# Patient Record
Sex: Female | Born: 1975
Health system: Southern US, Community
[De-identification: ages and names within clinical notes are randomized; demographics above are authoritative.]

## PROBLEM LIST (undated history)

## (undated) DIAGNOSIS — N920 Excessive and frequent menstruation with regular cycle: Secondary | ICD-10-CM

## (undated) DIAGNOSIS — E559 Vitamin D deficiency, unspecified: Secondary | ICD-10-CM

## (undated) DIAGNOSIS — E538 Deficiency of other specified B group vitamins: Secondary | ICD-10-CM

## (undated) DIAGNOSIS — M5430 Sciatica, unspecified side: Secondary | ICD-10-CM

## (undated) DIAGNOSIS — D259 Leiomyoma of uterus, unspecified: Secondary | ICD-10-CM

## (undated) HISTORY — DX: Excessive and frequent menstruation with regular cycle: N92.0

## (undated) HISTORY — DX: Vitamin D deficiency, unspecified: E55.9

## (undated) HISTORY — PX: NO PAST SURGERIES: SHX2092

## (undated) HISTORY — DX: Deficiency of other specified B group vitamins: E53.8

---

## 2007-04-16 ENCOUNTER — Ambulatory Visit: Payer: Self-pay | Admitting: Family Medicine

## 2013-01-09 ENCOUNTER — Emergency Department: Payer: Self-pay | Admitting: Emergency Medicine

## 2013-01-09 LAB — BASIC METABOLIC PANEL
Anion Gap: 10 (ref 7–16)
Chloride: 107 mmol/L (ref 98–107)
Creatinine: 0.98 mg/dL (ref 0.60–1.30)
EGFR (African American): >60
Glucose: 156 mg/dL — ABNORMAL HIGH (ref 65–99)
Osmolality: 284 (ref 275–301)
Potassium: 3.4 mmol/L — ABNORMAL LOW (ref 3.5–5.1)

## 2013-01-09 LAB — CBC
HCT: 40.4 % (ref 35.0–47.0)
MCH: 28.8 pg (ref 26.0–34.0)
MCHC: 34.4 g/dL (ref 32.0–36.0)
Platelet: 287 10*3/uL (ref 150–440)
RBC: 4.81 10*6/uL (ref 3.80–5.20)
WBC: 9.1 10*3/uL (ref 3.6–11.0)

## 2013-01-10 LAB — TSH: Thyroid Stimulating Horm: 0.903 u[IU]/mL

## 2013-01-27 ENCOUNTER — Other Ambulatory Visit: Payer: Self-pay | Admitting: Neurology

## 2013-01-27 DIAGNOSIS — R200 Anesthesia of skin: Secondary | ICD-10-CM

## 2013-01-31 ENCOUNTER — Other Ambulatory Visit: Payer: Self-pay

## 2013-02-09 ENCOUNTER — Ambulatory Visit
Admission: RE | Admit: 2013-02-09 | Discharge: 2013-02-09 | Disposition: A | Discharge status: Home / Self Care | Payer: BC Managed Care – PPO | Source: Ambulatory Visit | Attending: Neurology | Admitting: Neurology

## 2013-02-09 DIAGNOSIS — R200 Anesthesia of skin: Secondary | ICD-10-CM

## 2013-02-09 MED ORDER — GADOBENATE DIMEGLUMINE 529 MG/ML IV SOLN
13.0000 mL | Freq: Once | INTRAVENOUS | Status: AC | PRN
  Administered 2013-02-09: 13 mL via INTRAVENOUS

## 2016-12-25 ENCOUNTER — Encounter: Payer: Self-pay | Admitting: Obstetrics and Gynecology

## 2016-12-25 ENCOUNTER — Ambulatory Visit (INDEPENDENT_AMBULATORY_CARE_PROVIDER_SITE_OTHER): Payer: BC Managed Care – PPO | Admitting: Obstetrics and Gynecology

## 2016-12-25 VITALS — BP 100/70 | HR 82 | Ht 62.4 in | Wt 149.0 lb

## 2016-12-25 DIAGNOSIS — E559 Vitamin D deficiency, unspecified: Secondary | ICD-10-CM | POA: Diagnosis not present

## 2016-12-25 DIAGNOSIS — Z1231 Encounter for screening mammogram for malignant neoplasm of breast: Secondary | ICD-10-CM

## 2016-12-25 DIAGNOSIS — Z1239 Encounter for other screening for malignant neoplasm of breast: Secondary | ICD-10-CM

## 2016-12-25 DIAGNOSIS — Z01419 Encounter for gynecological examination (general) (routine) without abnormal findings: Secondary | ICD-10-CM

## 2016-12-25 DIAGNOSIS — Z13220 Encounter for screening for lipoid disorders: Secondary | ICD-10-CM

## 2016-12-25 NOTE — Progress Notes (Signed)
PCP:  Patient, No Pcp Per   Chief Complaint  Patient presents with  . Gynecologic Exam     HPI:      [name] is a 41 y.o. No obstetric history on file. who LMP was Patient's last menstrual period was 12/11/2016., presents today for her annual examination.  Her menses are regular every 28-30 days, lasting 5 days, med flow.  Dysmenorrhea mild, occurring first 1-2 days of flow. She does not have intermenstrual bleeding. She had issues with menorrhagia last yr but menses are better this yr (shorter and a little lighter). Sx are tolerable for pt.  Sex activity: single partner, contraception - condoms. Declines other BC.  Last Pap: December 21, 2015  Results were: no abnormalities /neg HPV DNA  Hx of STDs: none  Last mammogram: didn't do it last yr. Will do it this yr. There is no FH of breast cancer. There is no FH of ovarian cancer. The patient does do self-breast exams.  Tobacco use: The patient denies current or previous tobacco use. Alcohol use: none No drug use.  Exercise: moderately active  She does get adequate calcium but not Vitamin D in her diet.  She had Vitamin D deficiency last yr on labs as well as borderline lipids. She is not taking Vit D supp. She is due for lipid rechk.     Past Medical History:  Diagnosis Date  . B12 deficiency   . Menorrhagia   . Vitamin D deficiency     History reviewed. No pertinent surgical history.  Family History  Problem Relation Age of Onset  . Diabetes Mother   . Diabetes Father     Social History   Social History  . Marital status: Married    Spouse name: N/A  . Number of children: N/A  . Years of education: N/A   Occupational History  . Not on file.   Social History Main Topics  . Smoking status: Never Smoker  . Smokeless tobacco: Never Used  . Alcohol use Yes  . Drug use: No  . Sexual activity: Yes    Birth control/ protection: None   Other Topics Concern  . Not on file   Social History  Narrative  . No narrative on file    No outpatient prescriptions have been marked as taking for the 12/25/16 encounter (Office Visit) with Jannelly Bergren, Deirdre Evener, PA-C.     ROS:  Review of Systems  Constitutional: Negative for fatigue, fever and unexpected weight change.  Respiratory: Negative for cough, shortness of breath and wheezing.   Cardiovascular: Negative for chest pain, palpitations and leg swelling.  Gastrointestinal: Negative for blood in stool, constipation, diarrhea, nausea and vomiting.  Endocrine: Negative for cold intolerance, heat intolerance and polyuria.  Genitourinary: Negative for dyspareunia, dysuria, flank pain, frequency, genital sores, hematuria, menstrual problem, pelvic pain, urgency, vaginal bleeding, vaginal discharge and vaginal pain.  Musculoskeletal: Negative for back pain, joint swelling and myalgias.  Skin: Negative for rash.  Neurological: Negative for dizziness, syncope, light-headedness, numbness and headaches.  Hematological: Negative for adenopathy.  Psychiatric/Behavioral: Negative for agitation, confusion, sleep disturbance and suicidal ideas. The patient is not nervous/anxious.      Objective: BP 100/70   Pulse 82   Ht 5' 2.4" (1.585 m)   Wt 149 lb (67.6 kg)   LMP 12/11/2016   BMI 26.90 kg/m    Physical Exam  Constitutional: She is oriented to person, place, and time. She appears well-developed and well-nourished.  Genitourinary:  Vagina normal and uterus normal. There is no rash or tenderness on the right labia. There is no rash or tenderness on the left labia. No erythema or tenderness in the vagina. No vaginal discharge found. Right adnexum does not display mass and does not display tenderness. Left adnexum does not display mass and does not display tenderness. Cervix does not exhibit motion tenderness or polyp. Uterus is not enlarged or tender.  Neck: Normal range of motion. No thyromegaly present.  Cardiovascular: Normal rate, regular  rhythm and normal heart sounds.   No murmur heard. Pulmonary/Chest: Effort normal and breath sounds normal. Right breast exhibits no mass, no nipple discharge, no skin change and no tenderness. Left breast exhibits no mass, no nipple discharge, no skin change and no tenderness.  Abdominal: Soft. There is no tenderness. There is no guarding.  Musculoskeletal: Normal range of motion.  Neurological: She is alert and oriented to person, place, and time. No cranial nerve deficit.  Psychiatric: She has a normal mood and affect. Her behavior is normal.  Vitals reviewed.   Assessment/Plan: Encounter for annual routine gynecological examination  Screening for breast cancer - Pt to sched mammo. - Plan: MM DIGITAL SCREENING BILATERAL  Screening cholesterol level - Borderline last yr. Rechk labs.  - Plan: Lipid panel  Vitamin D deficiency - Add Vit D3 5000 IU daily.           GYN counsel mammography screening, adequate intake of calcium and vitamin D, diet and exercise     F/U  Return in about 1 year (around 12/25/2017).  Nickson Middlesworth B. Simcha Speir, PA-C 12/25/2016 8:37 AM

## 2016-12-26 LAB — LIPID PANEL
CHOLESTEROL TOTAL: 192 mg/dL (ref 100–199)
Chol/HDL Ratio: 3.7 ratio (ref 0.0–4.4)
HDL: 52 mg/dL (ref >39)
LDL Calculated: 112 mg/dL — ABNORMAL HIGH (ref 0–99)
TRIGLYCERIDES: 141 mg/dL (ref 0–149)
VLDL Cholesterol Cal: 28 mg/dL (ref 5–40)

## 2018-05-01 ENCOUNTER — Ambulatory Visit: Payer: BC Managed Care – PPO | Admitting: Family Medicine

## 2018-05-01 ENCOUNTER — Encounter: Payer: Self-pay | Admitting: Family Medicine

## 2018-05-01 VITALS — BP 110/70 | HR 86 | Temp 98.5°F | Resp 14 | Ht 62.0 in | Wt 160.0 lb

## 2018-05-01 DIAGNOSIS — E663 Overweight: Secondary | ICD-10-CM

## 2018-05-01 DIAGNOSIS — E559 Vitamin D deficiency, unspecified: Secondary | ICD-10-CM

## 2018-05-01 DIAGNOSIS — M25551 Pain in right hip: Secondary | ICD-10-CM | POA: Insufficient documentation

## 2018-05-01 DIAGNOSIS — S46819A Strain of other muscles, fascia and tendons at shoulder and upper arm level, unspecified arm, initial encounter: Secondary | ICD-10-CM

## 2018-05-01 DIAGNOSIS — Z1389 Encounter for screening for other disorder: Secondary | ICD-10-CM

## 2018-05-01 DIAGNOSIS — M5442 Lumbago with sciatica, left side: Secondary | ICD-10-CM

## 2018-05-01 DIAGNOSIS — Z23 Encounter for immunization: Secondary | ICD-10-CM | POA: Diagnosis not present

## 2018-05-01 DIAGNOSIS — Z1231 Encounter for screening mammogram for malignant neoplasm of breast: Secondary | ICD-10-CM

## 2018-05-01 DIAGNOSIS — E538 Deficiency of other specified B group vitamins: Secondary | ICD-10-CM

## 2018-05-01 DIAGNOSIS — M5441 Lumbago with sciatica, right side: Secondary | ICD-10-CM

## 2018-05-01 DIAGNOSIS — Z13220 Encounter for screening for lipoid disorders: Secondary | ICD-10-CM | POA: Diagnosis not present

## 2018-05-01 DIAGNOSIS — Z114 Encounter for screening for human immunodeficiency virus [HIV]: Secondary | ICD-10-CM

## 2018-05-01 DIAGNOSIS — M542 Cervicalgia: Secondary | ICD-10-CM

## 2018-05-01 DIAGNOSIS — L301 Dyshidrosis [pompholyx]: Secondary | ICD-10-CM

## 2018-05-01 DIAGNOSIS — Z1159 Encounter for screening for other viral diseases: Secondary | ICD-10-CM

## 2018-05-01 DIAGNOSIS — G8929 Other chronic pain: Secondary | ICD-10-CM

## 2018-05-01 DIAGNOSIS — Z8261 Family history of arthritis: Secondary | ICD-10-CM

## 2018-05-01 NOTE — Progress Notes (Signed)
Name: Sarah Carrillo   MRN: [id number]    DOB: June 05, 1975   Date:05/01/2018       Progress Note  Subjective  Chief Complaint  Chief Complaint  Patient presents with  . Establish Care  . Hip Pain    righ hip    HPI  PT presents to establish care. She has not been seen in several years.   MSK Pain: Shoulder, neck, bilateral lower back pain with BLE radiation, LEFT hip pain.  LEFT hip pain is the worst area - has been flaring up over the last month or so.  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  She has been taking yoga classes and this seems to help her pain. Endorses morning joint stiffness; no swelling, redness of joints, no small joint involvement. Takes 432m Ibuprofen PRN and this takes care of the pain.  She notes pain is well controlled today.  We will check inflammatory markers today, and will also have her return if she has a pain flare to recheck labs during a flare.  Mom has history of RA.  Overweight: Started doing yoga recently.  She has a dietician at her job which she can utilize for free.  Enjoys cooking and eating fresh vegetables. Works late at night and has a long commute - eats fast food sometimes.  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    Dry Skin/Abnormal Nevus: She would like to see a dermatologist.  Has history of dyshydrotic eczema on bilateral feet - has some dry scaling skin on feet still.  Also has nevus on the right posterior shoulder that has been present for several years, but has been growing in size lately. No bleeding or drainage from the nevus.   Health Maintenance: See orders.  Patient Active Problem List   Diagnosis Date Noted  . B12 deficiency   . Vitamin D deficiency 12/25/2016    History reviewed. No pertinent surgical history.  Family History  Problem Relation Age of Onset  . Diabetes Mother   . Rheum arthritis Mother   . Hypertension Mother   .  Hyperlipidemia Mother   . Diabetes Father   . Hyperlipidemia Sister   . Hyperlipidemia Sister     Social History   Socioeconomic History  . Marital status: Married    Spouse name: SMarzetta Board . Number of children: 2  . Years of education: Not on file  . Highest education level: Not on file  Occupational History  . Not on file  Social Needs  . Financial resource strain: Not hard at all  . Food insecurity:    Worry: Never true    Inability: Never true  . Transportation needs:    Medical: No    Non-medical: No  Tobacco Use  . Smoking status: Never Smoker  . Smokeless tobacco: Never Used  Substance and Sexual Activity  . Alcohol use: Yes    Comment: occasional  . Drug use: No  . Sexual activity: Yes    Partners: Male    Birth control/protection: None  Lifestyle  . Physical activity:    Days per week: 1 day    Minutes per session: 30 min  . Stress: Not at all  Relationships  . Social connections:    Talks on phone: More than three times a week    Gets together: More than three times a  week    Attends religious service: 1 to 4 times per year    Active member of club or organization: No    Attends meetings of clubs or organizations: Never    Relationship status: Married  . Intimate partner violence:    Fear of current or ex partner: No    Emotionally abused: No    Physically abused: No    Forced sexual activity: No  Other Topics Concern  . Not on file  Social History Narrative   Married - Husband's name is Erline Levine; Son is Chase (1yo), Daughter Luvenia Starch (26) in college.    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    No current outpatient medications on file.  Allergies  Allergen Reactions  . Penicillin G Other (See Comments)  . Sulfa Antibiotics Rash    I personally reviewed active problem list, medication list, allergies, family history, social history, health maintenance, lab results with the patient/caregiver today.   ROS  Constitutional: Negative for  fever or weight change.  Respiratory: Negative for cough and shortness of breath.   Cardiovascular: Negative for chest pain or palpitations.  Gastrointestinal: Negative for abdominal pain, no bowel changes.  Musculoskeletal: Negative for gait problem or joint swelling.  Skin: Negative for rash.  Neurological: Negative for dizziness or headache.  No other specific complaints in a complete review of systems (except as listed in HPI above).   Objective  Vitals:   05/01/18 0848  BP: 110/70  Pulse: 86  Resp: 14  Temp: 98.5 F (36.9 C)  SpO2: 94%  Weight: 160 lb (72.6 kg)  Height: '5\' 2"'  (1.575 m)   Body mass index is 29.26 kg/m.  Physical Exam Constitutional: Patient appears well-developed and well-nourished. No distress.  HENT: Head: Normocephalic and atraumatic.  Eyes: Conjunctivae and EOM are normal. No scleral icterus. Neck: Normal range of motion. Neck supple. No JVD present. No thyromegaly present.  Cardiovascular: Normal rate, regular rhythm and normal heart sounds.  No murmur heard. No BLE edema. Pulmonary/Chest: Effort normal and breath sounds normal. No respiratory distress. Musculoskeletal: Normal range of motion, no joint effusions. No gross deformities Neurological: Pt is alert and oriented to person, place, and time. No cranial nerve deficit. Coordination, balance, strength, speech and gait are normal.  Skin: Skin is warm and dry. No rash noted. No erythema. Single raised, round, firm, hyperpigmented lesion that is non-tender with no underlying erythema to the right posterior shoulder. Approx 1cm diameter. Psychiatric: Patient has a normal mood and affect. behavior is normal. Judgment and thought content normal.  No results found for this or any previous visit (from the past 72 hour(s)).  PHQ2/9: Depression screen [facility] 2/9 05/01/2018  Decreased Interest 0  Down, Depressed, Hopeless 0  PHQ - 2 Score 0  Altered sleeping 0  Tired, decreased energy 0  Change in  appetite 0  Feeling bad or failure about yourself  0  Trouble concentrating 0  Moving slowly or fidgety/restless 0  Suicidal thoughts 0  PHQ-9 Score 0  Difficult doing work/chores Not difficult at all   Fall Risk: Fall Risk  05/01/2018  Falls in the past year? 0  Number falls in past yr: 0  Injury with Fall? 0  Follow up Falls evaluation completed   Assessment & Plan  1. Right hip pain - C-reactive protein - Sed Rate (ESR)  2. Chronic bilateral low back pain with bilateral sciatica - C-reactive protein - Sed Rate (ESR)  3. Overweight (BMI 25.0-29.9) - COMPLETE  METABOLIC PANEL WITH GFR - Lipid panel - TSH - Discussed importance of 150 minutes of physical activity weekly, eat two servings of fish weekly, eat one serving of tree nuts ( cashews, pistachios, pecans, almonds.Marland Kitchen) every other day, eat 6 servings of fruit/vegetables daily and drink plenty of water and avoid sweet beverages.  4. Lipid screening - Lipid panel  5. Family history of rheumatoid arthritis - C-reactive protein - Sed Rate (ESR) - CBC w/Diff/Platelet  6. Encounter for surveillance of abnormal nevi - Ambulatory referral to Dermatology  7. Neck pain - C-reactive protein - Sed Rate (ESR)  8. Strain of trapezius muscle, unspecified laterality, initial encounter - C-reactive protein - Sed Rate (ESR)  9. Dyshidrotic eczema - Ambulatory referral to Dermatology  10. B12 deficiency - B12 and Folate Panel  11. Vitamin D deficiency - VITAMIN D 25 Hydroxy (Vit-D Deficiency, Fractures)  12. Encounter for screening for HIV - HIV Antibody (routine testing w rflx)  13. Need for hepatitis C screening test - Hepatitis C antibody  14. Breast cancer screening by mammogram - MM 3D SCREEN BREAST BILATERAL; Future  15. Need for Tdap vaccination - Tdap vaccine greater than or equal to 7yo IM  16. Needs flu shot - Flu Vaccine QUAD 6+ mos PF IM (Fluarix Quad PF)

## 2018-05-02 LAB — CBC WITH DIFFERENTIAL/PLATELET
ABSOLUTE MONOCYTES: 312 {cells}/uL (ref 200–950)
Basophils Absolute: 42 cells/uL (ref 0–200)
Basophils Relative: 0.7 %
Eosinophils Absolute: 108 cells/uL (ref 15–500)
Eosinophils Relative: 1.8 %
HCT: 41.8 % (ref 35.0–45.0)
Hemoglobin: 13.9 g/dL (ref 11.7–15.5)
Lymphs Abs: 2052 cells/uL (ref 850–3900)
MCH: 27.4 pg (ref 27.0–33.0)
MCHC: 33.3 g/dL (ref 32.0–36.0)
MCV: 82.4 fL (ref 80.0–100.0)
MPV: 10.8 fL (ref 7.5–12.5)
Monocytes Relative: 5.2 %
Neutro Abs: 3486 cells/uL (ref 1500–7800)
Neutrophils Relative %: 58.1 %
Platelets: 306 10*3/uL (ref 140–400)
RBC: 5.07 10*6/uL (ref 3.80–5.10)
RDW: 12.5 % (ref 11.0–15.0)
Total Lymphocyte: 34.2 %
WBC: 6 10*3/uL (ref 3.8–10.8)

## 2018-05-02 LAB — COMPLETE METABOLIC PANEL WITH GFR
AG Ratio: 1.6 (calc) (ref 1.0–2.5)
ALKALINE PHOSPHATASE (APISO): 55 U/L (ref 33–115)
ALT: 9 U/L (ref 6–29)
AST: 13 U/L (ref 10–30)
Albumin: 4.6 g/dL (ref 3.6–5.1)
BUN: 15 mg/dL (ref 7–25)
CO2: 30 mmol/L (ref 20–32)
Calcium: 9.9 mg/dL (ref 8.6–10.2)
Chloride: 104 mmol/L (ref 98–110)
Creat: 0.73 mg/dL (ref 0.50–1.10)
GFR, Est African American: 117 mL/min/{1.73_m2} (ref >=60)
GFR, Est Non African American: 101 mL/min/{1.73_m2} (ref >=60)
Globulin: 2.9 g/dL (calc) (ref 1.9–3.7)
Glucose, Bld: 85 mg/dL (ref 65–99)
Potassium: 4.8 mmol/L (ref 3.5–5.3)
Sodium: 140 mmol/L (ref 135–146)
Total Bilirubin: 0.6 mg/dL (ref 0.2–1.2)
Total Protein: 7.5 g/dL (ref 6.1–8.1)

## 2018-05-02 LAB — TSH: TSH: 0.4 mIU/L

## 2018-05-02 LAB — SEDIMENTATION RATE: Sed Rate: 17 mm/h (ref 0–20)

## 2018-05-02 LAB — HEPATITIS C ANTIBODY
Hepatitis C Ab: NONREACTIVE
SIGNAL TO CUT-OFF: 0.14 (ref <1.00)

## 2018-05-02 LAB — LIPID PANEL
Cholesterol: 191 mg/dL (ref <200)
HDL: 46 mg/dL — ABNORMAL LOW (ref >50)
LDL Cholesterol (Calc): 123 mg/dL (calc) — ABNORMAL HIGH
Non-HDL Cholesterol (Calc): 145 mg/dL (calc) — ABNORMAL HIGH (ref <130)
Total CHOL/HDL Ratio: 4.2 (calc) (ref <5.0)
Triglycerides: 108 mg/dL (ref <150)

## 2018-05-02 LAB — C-REACTIVE PROTEIN: CRP: 6.3 mg/L (ref <8.0)

## 2018-05-02 LAB — B12 AND FOLATE PANEL
Folate: 15.7 ng/mL
Vitamin B-12: 339 pg/mL (ref 200–1100)

## 2018-05-02 LAB — HIV ANTIBODY (ROUTINE TESTING W REFLEX): HIV 1&2 Ab, 4th Generation: NONREACTIVE

## 2018-05-02 LAB — VITAMIN D 25 HYDROXY (VIT D DEFICIENCY, FRACTURES): Vit D, 25-Hydroxy: 10 ng/mL — ABNORMAL LOW (ref 30–100)

## 2018-05-04 ENCOUNTER — Other Ambulatory Visit: Payer: Self-pay | Admitting: Family Medicine

## 2018-05-04 DIAGNOSIS — E559 Vitamin D deficiency, unspecified: Secondary | ICD-10-CM

## 2018-05-04 MED ORDER — VITAMIN D (ERGOCALCIFEROL) 1.25 MG (50000 UNIT) PO CAPS: 50000.0000 [IU] | ORAL_CAPSULE | ORAL | 0 refills | Status: DC

## 2018-06-30 ENCOUNTER — Encounter: Payer: BC Managed Care – PPO | Admitting: Family Medicine

## 2018-11-26 ENCOUNTER — Ambulatory Visit (INDEPENDENT_AMBULATORY_CARE_PROVIDER_SITE_OTHER): Payer: BC Managed Care – PPO | Admitting: Nurse Practitioner

## 2018-11-26 ENCOUNTER — Other Ambulatory Visit: Payer: Self-pay

## 2018-11-26 ENCOUNTER — Encounter: Payer: Self-pay | Admitting: Nurse Practitioner

## 2018-11-26 VITALS — Resp 14

## 2018-11-26 DIAGNOSIS — K625 Hemorrhage of anus and rectum: Secondary | ICD-10-CM | POA: Diagnosis not present

## 2018-11-26 DIAGNOSIS — K644 Residual hemorrhoidal skin tags: Secondary | ICD-10-CM | POA: Diagnosis not present

## 2018-11-26 DIAGNOSIS — R143 Flatulence: Secondary | ICD-10-CM

## 2018-11-26 NOTE — Patient Instructions (Addendum)
- Use rectal hydrocortisone cream 1-2.5% twice daily for one week; can additionally add stool softner like docusate sodium if having any straining with bowel movements. Use donut pillow, avoid sitting for long periods- or break up sitting periods with standing.  Hemorrhoids Hemorrhoids are swollen veins in and around the rectum or anus. There are two types of hemorrhoids:  Internal hemorrhoids. These occur in the veins that are just inside the rectum. They may poke through to the outside and become irritated and painful.  External hemorrhoids. These occur in the veins that are outside the anus and can be felt as a painful swelling or hard lump near the anus. Most hemorrhoids do not cause serious problems, and they can be managed with home treatments such as diet and lifestyle changes. If home treatments do not help the symptoms, procedures can be done to shrink or remove the hemorrhoids. What are the causes? This condition is caused by increased pressure in the anal area. This pressure may result from various things, including:  Constipation.  Straining to have a bowel movement.  Diarrhea.  Pregnancy.  Obesity.  Sitting for long periods of time.  Heavy lifting or other activity that causes you to strain.  Anal sex.  Riding a bike for a long period of time. What are the signs or symptoms? Symptoms of this condition include:  Pain.  Anal itching or irritation.  Rectal bleeding.  Leakage of stool (feces).  Anal swelling.  One or more lumps around the anus. How is this diagnosed? This condition can often be diagnosed through a visual exam. Other exams or tests may also be done, such as:  An exam that involves feeling the rectal area with a gloved hand (digital rectal exam).  An exam of the anal canal that is done using a small tube (anoscope).  A blood test, if you have lost a significant amount of blood.  A test to look inside the colon using a flexible tube with a  camera on the end (sigmoidoscopy or colonoscopy). How is this treated? This condition can usually be treated at home. However, various procedures may be done if dietary changes, lifestyle changes, and other home treatments do not help your symptoms. These procedures can help make the hemorrhoids smaller or remove them completely. Some of these procedures involve surgery, and others do not. Common procedures include:  Rubber band ligation. Rubber bands are placed at the base of the hemorrhoids to cut off their blood supply.  Sclerotherapy. Medicine is injected into the hemorrhoids to shrink them.  Infrared coagulation. A type of light energy is used to get rid of the hemorrhoids.  Hemorrhoidectomy surgery. The hemorrhoids are surgically removed, and the veins that supply them are tied off.  Stapled hemorrhoidopexy surgery. The surgeon staples the base of the hemorrhoid to the rectal wall. Follow these instructions at home: Eating and drinking   Eat foods that have a lot of fiber in them, such as whole grains, beans, nuts, fruits, and vegetables.  Ask your health care provider about taking products that have added fiber (fiber supplements).  Reduce the amount of fat in your diet. You can do this by eating low-fat dairy products, eating less red meat, and avoiding processed foods.  Drink enough fluid to keep your urine pale yellow. Managing pain and swelling  Take warm sitz baths for 20 minutes, 3-4 times a day to ease pain and discomfort. You may do this in a bathtub or using a portable sitz bath that  fits over the toilet.  If directed, apply ice to the affected area. Using ice packs between sitz baths may be helpful. ? Put ice in a plastic bag. ? Place a towel between your skin and the bag. ? Leave the ice on for 20 minutes, 2-3 times a day. General instructions  Take over-the-counter and prescription medicines only as told by your health care provider.  Use medicated creams or  suppositories as told.  Get regular exercise. Ask your health care provider how much and what kind of exercise is best for you. In general, you should do moderate exercise for at least 30 minutes on most days of the week (150 minutes each week). This can include activities such as walking, biking, or yoga.  Go to the bathroom when you have the urge to have a bowel movement. Do not wait.  Avoid straining to have bowel movements.  Keep the anal area dry and clean. Use wet toilet paper or moist towelettes after a bowel movement.  Do not sit on the toilet for long periods of time. This increases blood pooling and pain.  Keep all follow-up visits as told by your health care provider. This is important. Contact a health care provider if you have:  Increasing pain and swelling that are not controlled by treatment or medicine.  Difficulty having a bowel movement, or you are unable to have a bowel movement.  Pain or inflammation outside the area of the hemorrhoids. Get help right away if you have:  Uncontrolled bleeding from your rectum. Summary  Hemorrhoids are swollen veins in and around the rectum or anus.  Most hemorrhoids can be managed with home treatments such as diet and lifestyle changes.  Taking warm sitz baths can help ease pain and discomfort.  In severe cases, procedures or surgery can be done to shrink or remove the hemorrhoids. This information is not intended to replace advice given to you by your health care provider. Make sure you discuss any questions you have with your health care provider. Document Released: 03/22/2000 Document Revised: 04/02/2018 Document Reviewed: 08/14/2017 Elsevier Patient Education  [place]  FODMAPs (fermentable oligosaccharides, disaccharides, monosaccharides, and polyols) are sugars that are hard for some people to digest. A low-FODMAP eating plan may help some people who have bowel (intestinal) diseases  to manage their symptoms. This meal plan can be complicated to follow. Work with a diet and nutrition specialist (dietitian) to make a low-FODMAP eating plan that is right for you. A dietitian can make sure that you get enough nutrition from this diet. What are tips for following this plan? Reading food labels  Check labels for hidden FODMAPs such as: ? High-fructose syrup. ? Honey. ? Agave. ? Natural fruit flavors. ? Onion or garlic powder.  Choose low-FODMAP foods that contain 3-4 grams of fiber per serving.  Check food labels for serving sizes. Eat only one serving at a time to make sure FODMAP levels stay low. Meal planning  Follow a low-FODMAP eating plan for up to 6 weeks, or as told by your health care provider or dietitian.  To follow the eating plan: 1. Eliminate high-FODMAP foods from your diet completely. 2. Gradually reintroduce high-FODMAP foods into your diet one at a time. Most people should wait a few days after introducing one high-FODMAP food before they introduce the next high-FODMAP food. Your dietitian can recommend how quickly you may reintroduce foods. 3. Keep a daily record of what you eat and  drink, and make note of any symptoms that you have after eating. 4. Review your daily record with a dietitian regularly. Your dietitian can help you identify which foods you can eat and which foods you should avoid. General tips  Drink enough fluid each day to keep your urine pale yellow.  Avoid processed foods. These often have added sugar and may be high in FODMAPs.  Avoid most dairy products, whole grains, and sweeteners.  Work with a dietitian to make sure you get enough fiber in your diet. Recommended foods Grains  Gluten-free grains, such as rice, oats, buckwheat, quinoa, corn, polenta, and millet. Gluten-free pasta, bread, or cereal. Rice noodles. Corn tortillas. Vegetables  Eggplant, zucchini, cucumber, peppers, green beans, Brussels sprouts, bean sprouts,  lettuce, arugula, kale, Swiss chard, spinach, collard greens, bok choy, summer squash, potato, and tomato. Limited amounts of corn, carrot, and sweet potato. Green parts of scallions. Fruits  Bananas, oranges, lemons, limes, blueberries, raspberries, strawberries, grapes, cantaloupe, honeydew melon, kiwi, papaya, passion fruit, and pineapple. Limited amounts of dried cranberries, banana chips, and shredded coconut. Dairy  Lactose-free milk, yogurt, and kefir. Lactose-free cottage cheese and ice cream. Non-dairy milks, such as almond, coconut, hemp, and rice milk. Yogurts made of non-dairy milks. Limited amounts of goat cheese, brie, mozzarella, parmesan, swiss, and other hard cheeses. Meats and other protein foods  Unseasoned beef, pork, poultry, or fish. Eggs. Berniece Salines. Tofu (firm) and tempeh. Limited amounts of nuts and seeds, such as almonds, walnuts, [place] nuts, pecans, peanuts, pumpkin seeds, chia seeds, and sunflower seeds. Fats and oils  Butter-free spreads. Vegetable oils, such as olive, canola, and sunflower oil. Seasoning and other foods  Artificial sweeteners with names that do not end in "ol" such as aspartame, saccharine, and stevia. Maple syrup, white table sugar, raw sugar, brown sugar, and molasses. Fresh basil, coriander, parsley, rosemary, and thyme. Beverages  Water and mineral water. Sugar-sweetened soft drinks. Small amounts of orange juice or cranberry juice. Black and green tea. Most dry wines. Coffee. This may not be a complete list of low-FODMAP foods. Talk with your dietitian for more information. Foods to avoid Grains  Wheat, including kamut, durum, and semolina. Barley and bulgur. Couscous. Wheat-based cereals. Wheat noodles, bread, crackers, and pastries. Vegetables  Chicory root, artichoke, asparagus, cabbage, snow peas, sugar snap peas, mushrooms, and cauliflower. Onions, garlic, leeks, and the white part of scallions. Fruits  Fresh, dried, and juiced forms  of apple, pear, watermelon, peach, plum, cherries, apricots, blackberries, boysenberries, figs, nectarines, and mango. Avocado. Dairy  Milk, yogurt, ice cream, and soft cheese. Cream and sour cream. Milk-based sauces. Custard. Meats and other protein foods  Fried or fatty meat. Sausage. Cashews and pistachios. Soybeans, baked beans, black beans, chickpeas, kidney beans, fava beans, navy beans, lentils, and split peas. Seasoning and other foods  Any sugar-free gum or candy. Foods that contain artificial sweeteners such as sorbitol, mannitol, isomalt, or xylitol. Foods that contain honey, high-fructose corn syrup, or agave. Bouillon, vegetable stock, beef stock, and chicken stock. Garlic and onion powder. Condiments made with onion, such as hummus, chutney, pickles, relish, salad dressing, and salsa. Tomato paste. Beverages  Chicory-based drinks. Coffee substitutes. Chamomile tea. Fennel tea. Sweet or fortified wines such as port or sherry. Diet soft drinks made with isomalt, mannitol, maltitol, sorbitol, or xylitol. Apple, pear, and mango juice. Juices with high-fructose corn syrup. This may not be a complete list of high-FODMAP foods. Talk with your dietitian to discuss what dietary choices are best for you.  Summary  A low-FODMAP eating plan is a short-term diet that eliminates FODMAPs from your diet to help ease symptoms of certain bowel diseases.  The eating plan usually lasts up to 6 weeks. After that, high-FODMAP foods are restarted gradually, one at a time, so you can find out which may be causing symptoms.  A low-FODMAP eating plan can be complicated. It is best to work with a dietitian who has experience with this type of plan. This information is not intended to replace advice given to you by your health care provider. Make sure you discuss any questions you have with your health care provider. Document Released: 11/19/2016 Document Revised: 03/07/2017 Document Reviewed: 11/19/2016  Elsevier Patient Education  2020 [employer].

## 2018-11-26 NOTE — Progress Notes (Signed)
Virtual Visit via Video Note  I connected with Sarah Carrillo on 11/26/18 at 11:40 AM EDT by a video enabled telemedicine application and verified that I am speaking with the correct person using two identifiers.   Staff discussed the limitations of evaluation and management by telemedicine and the availability of in person appointments. The patient expressed understanding and agreed to proceed.  Patient location: home  My location: home office Other people present:  none HPI  States has noticed bright red blood in stools for the past 2 days. Had a similar episode last month that self-resolved. Denies severe constipation- but when these episodes happened did have to strain with bowel movement. Had a history of hemorrhoid after having children's.  States she does feel like she has a lot of abdominal bloating gas for the last couple of years. Patient tries to avoid fast food but did have some last night, eats a lot of vegetables and salads, eats more poorly when she is busy. Endorses flatulence after meals with improvement of symptoms.  Denies abdominal pain, nausea, vomiting, diarrhea.  No family history of colorectal cancers.  More sitting with work from home sitting for up to 14 hours. Denies anal trauma PHQ2/9: Depression screen [facility] 2/9 11/26/2018 05/01/2018  Decreased Interest 0 0  Down, Depressed, Hopeless 0 0  PHQ - 2 Score 0 0  Altered sleeping 0 0  Tired, decreased energy 0 0  Change in appetite 0 0  Feeling bad or failure about yourself  0 0  Trouble concentrating 0 0  Moving slowly or fidgety/restless 0 0  Suicidal thoughts 0 0  PHQ-9 Score 0 0  Difficult doing work/chores Not difficult at all Not difficult at all     PHQ reviewed. Negative  Patient Active Problem List   Diagnosis Date Noted  . Right hip pain 05/01/2018  . Chronic bilateral low back pain with bilateral sciatica 05/01/2018  . Overweight (BMI 25.0-29.9) 05/01/2018  . Family history of rheumatoid arthritis  05/01/2018  . Neck pain 05/01/2018  . Trapezius strain 05/01/2018  . Dyshidrotic eczema 05/01/2018  . B12 deficiency   . Vitamin D deficiency 12/25/2016    Past Medical History:  Diagnosis Date  . B12 deficiency   . Menorrhagia   . Vitamin D deficiency     History reviewed. No pertinent surgical history.  Social History   Tobacco Use  . Smoking status: Never Smoker  . Smokeless tobacco: Never Used  Substance Use Topics  . Alcohol use: Yes    Comment: occasional     Current Outpatient Medications:  .  cholecalciferol (VITAMIN D3) 25 MCG (1000 UT) tablet, Take 1,000 Units by mouth daily., Disp: , Rfl:   Allergies  Allergen Reactions  . Penicillin G Other (See Comments)  . Sulfa Antibiotics Rash    ROS   No other specific complaints in a complete review of systems (except as listed in HPI above).  Objective  Vitals:   11/26/18 1208  Resp: 14     There is no height or weight on file to calculate BMI.  Nursing Note and Vital Signs reviewed.  Physical Exam  Constitutional: Patient appears well-developed and well-nourished. No distress.  HENT: Head: Normocephalic and atraumatic. Pulmonary/Chest: Effort normal  Musculoskeletal: Normal range of motion,  Neurological: alert and oriented, speech normal.  Psychiatric: Patient has a normal mood and affect. behavior is normal. Judgment and thought content normal.    Assessment & Plan  1. BRBPR (bright red blood per rectum) Discussed  likely from hemorrhoid, if persistent with treatment let us know   2. External hemorrhoid, bleeding OTC relief discussed, increase fiber, donut pillow  Referral to GI if not improved in 4-6 weeks or if worsening at anytime.   3. Flatulence Low fodmap diet discussed.     Follow Up Instructions:    I discussed the assessment and treatment plan with the patient. The patient was provided an opportunity to ask questions and all were answered. The patient agreed with the plan  and demonstrated an understanding of the instructions.   The patient was advised to call back or seek an in-person evaluation if the symptoms worsen or if the condition fails to improve as anticipated.  I provided 15 minutes of non-face-to-face time during this encounter.   Fredderick Severance, NP

## 2018-11-30 ENCOUNTER — Ambulatory Visit: Payer: BC Managed Care – PPO | Admitting: Obstetrics and Gynecology

## 2019-01-07 ENCOUNTER — Ambulatory Visit
Admission: RE | Admit: 2019-01-07 | Discharge: 2019-01-07 | Disposition: A | Discharge status: Home / Self Care | Payer: BC Managed Care – PPO | Source: Ambulatory Visit | Attending: Family Medicine | Admitting: Family Medicine

## 2019-01-07 DIAGNOSIS — Z1231 Encounter for screening mammogram for malignant neoplasm of breast: Secondary | ICD-10-CM

## 2019-01-20 NOTE — Progress Notes (Signed)
PCP:  Hubbard Hartshorn, FNP   Chief Complaint  Patient presents with  . Gynecologic Exam     HPI:      [name] is a 43 y.o. No obstetric history on file. who LMP was Patient's last menstrual period was 12/28/2018., presents today for her annual examination.  Her menses are regular every 28-30 days, lasting 5 days, med flow.  Dysmenorrhea mild, occurring first 1-2 days of flow. She does not have intermenstrual bleeding. She had issues with menorrhagia 2 yrs ago but menses are better now. Sarah Carrillo has a heavier period but sx are tolerable for pt.  Sex activity: single partner, contraception - condoms. Declines other BC.  Last Pap: December 21, 2015  Results were: no abnormalities /neg HPV DNA  Hx of STDs: none  Last mammogram: 01/07/19 Results: no abnormalities; repeat in 12 months.  There is no FH of breast cancer. There is no FH of ovarian cancer. The patient does do self-breast exams.  Tobacco use: The patient denies current or previous tobacco use. Alcohol use: none No drug use.  Exercise: moderately active  She does get adequate calcium and Vitamin D in her diet. Hx of Vit D deficiency, given Rx Vit D earlier this yr, now doing OTC supp. Would like recheck. Feels better and back pain resolved with Vit D use.  Has had hacking cough at night only recently. No other covid sx. No sx during the day. Thinks it's allergies and taking Claritin. Did try dayquil with sx relief.   Labs with PCP.  Past Medical History:  Diagnosis Date  . B12 deficiency   . Menorrhagia   . Vitamin D deficiency     History reviewed. No pertinent surgical history.  Family History  Problem Relation Age of Onset  . Diabetes Mother   . Rheum arthritis Mother   . Hypertension Mother   . Hyperlipidemia Mother   . Diabetes Father   . Hyperlipidemia Sister   . Hyperlipidemia Sister   . Breast cancer Neg Hx     Social History   Socioeconomic History  . Marital status: Married    Spouse  name: Marzetta Board  . Number of children: 2  . Years of education: Not on file  . Highest education level: Not on file  Occupational History  . Not on file  Social Needs  . Financial resource strain: Not hard at all  . Food insecurity    Worry: Never true    Inability: Never true  . Transportation needs    Medical: No    Non-medical: No  Tobacco Use  . Smoking status: Never Smoker  . Smokeless tobacco: Never Used  Substance and Sexual Activity  . Alcohol use: Yes    Comment: occasional  . Drug use: No  . Sexual activity: Yes    Partners: Male    Birth control/protection: None  Lifestyle  . Physical activity    Days per week: 1 day    Minutes per session: 30 min  . Stress: Not at all  Relationships  . Social connections    Talks on phone: More than three times a week    Gets together: More than three times a week    Attends religious service: 1 to 4 times per year    Active member of club or organization: No    Attends meetings of clubs or organizations: Never    Relationship status: Married  . Intimate partner violence    Fear of  current or ex partner: No    Emotionally abused: No    Physically abused: No    Forced sexual activity: No  Other Topics Concern  . Not on file  Social History Narrative   Married - Husband's name is Erline Levine; Son is Chase (16yo), Daughter Luvenia Starch (68) in college.    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    No outpatient medications have been marked as taking for the 01/21/19 encounter (Office Visit) with ,  B, PA-C.     ROS:  Review of Systems  Constitutional: Negative for fatigue, fever and unexpected weight change.  Respiratory: Positive for cough. Negative for shortness of breath and wheezing.   Cardiovascular: Negative for chest pain, palpitations and leg swelling.  Gastrointestinal: Negative for blood in stool, constipation, diarrhea, nausea and vomiting.  Endocrine: Negative for cold intolerance, heat intolerance  and polyuria.  Genitourinary: Negative for dyspareunia, dysuria, flank pain, frequency, genital sores, hematuria, menstrual problem, pelvic pain, urgency, vaginal bleeding, vaginal discharge and vaginal pain.  Musculoskeletal: Negative for back pain, joint swelling and myalgias.  Skin: Negative for rash.  Neurological: Negative for dizziness, syncope, light-headedness, numbness and headaches.  Hematological: Negative for adenopathy.  Psychiatric/Behavioral: Negative for agitation, confusion, sleep disturbance and suicidal ideas. The patient is not nervous/anxious.      Objective: BP 100/60   Ht 5' 2.5" (1.588 m)   Wt 164 lb (74.4 kg)   LMP 12/28/2018   BMI 29.52 kg/m    Physical Exam Constitutional:      General: She is not in acute distress.    Appearance: She is well-developed.  Genitourinary:     Vulva, vagina, uterus, right adnexa and left adnexa normal.     No vulval lesion, tenderness or ulcerations noted.     No vaginal discharge, erythema, tenderness or bleeding.     No cervical motion tenderness or polyp.     Uterus is not enlarged or tender.     No right or left adnexal mass present.     Right adnexa not tender.     Left adnexa not tender.  Neck:     Musculoskeletal: Normal range of motion.     Thyroid: No thyromegaly.  Cardiovascular:     Rate and Rhythm: Normal rate and regular rhythm.     Heart sounds: Normal heart sounds. No murmur.  Pulmonary:     Effort: Pulmonary effort is normal.     Breath sounds: Normal breath sounds.  Chest:     Breasts:        Right: No mass, nipple discharge, skin change or tenderness.        Left: No mass, nipple discharge, skin change or tenderness.  Abdominal:     Palpations: Abdomen is soft.     Tenderness: There is no abdominal tenderness. There is no guarding.  Musculoskeletal: Normal range of motion.  Neurological:     General: No focal deficit present.     Mental Status: She is alert and oriented to person, place, and  time.     Cranial Nerves: No cranial nerve deficit.  Skin:    General: Skin is warm and dry.  Psychiatric:        Mood and Affect: Mood normal.        Behavior: Behavior normal.        Thought Content: Thought content normal.        Judgment: Judgment normal.  Vitals signs and nursing note reviewed.  Assessment/Plan: Encounter for annual routine gynecological examination  Encounter for screening mammogram for malignant neoplasm of breast; pt current on mammo  Vitamin D deficiency - Plan: VITAMIN D 25 Hydroxy (Vit-D Deficiency, Fractures); Lab recheck today, will call with results.   Need for immunization against influenza - Plan: Flu Vaccine QUAD 36+ mos IM           GYN counsel mammography screening, adequate intake of calcium and vitamin D, diet and exercise     F/U  Return in about 1 year (around 01/21/2020).   B. , PA-C 01/21/2019 11:37 AM

## 2019-01-20 NOTE — Patient Instructions (Signed)
I value your feedback and entrusting us with your care. If you get a [facility] patient survey, I would appreciate you taking the time to let us know about your experience today. Thank you! 

## 2019-01-21 ENCOUNTER — Ambulatory Visit (INDEPENDENT_AMBULATORY_CARE_PROVIDER_SITE_OTHER): Payer: BC Managed Care – PPO | Admitting: Obstetrics and Gynecology

## 2019-01-21 ENCOUNTER — Encounter: Payer: Self-pay | Admitting: Obstetrics and Gynecology

## 2019-01-21 ENCOUNTER — Other Ambulatory Visit: Payer: Self-pay

## 2019-01-21 VITALS — BP 100/60 | Ht 62.5 in | Wt 164.0 lb

## 2019-01-21 DIAGNOSIS — Z1231 Encounter for screening mammogram for malignant neoplasm of breast: Secondary | ICD-10-CM

## 2019-01-21 DIAGNOSIS — Z23 Encounter for immunization: Secondary | ICD-10-CM

## 2019-01-21 DIAGNOSIS — Z01419 Encounter for gynecological examination (general) (routine) without abnormal findings: Secondary | ICD-10-CM

## 2019-01-21 DIAGNOSIS — E559 Vitamin D deficiency, unspecified: Secondary | ICD-10-CM

## 2019-01-22 ENCOUNTER — Ambulatory Visit: Payer: BC Managed Care – PPO | Admitting: Family Medicine

## 2019-01-22 LAB — VITAMIN D 25 HYDROXY (VIT D DEFICIENCY, FRACTURES): Vit D, 25-Hydroxy: 26.2 ng/mL — ABNORMAL LOW (ref 30.0–100.0)

## 2019-01-22 NOTE — Progress Notes (Signed)
Pls let pt know Vit D still low but improved. Cont Vit D3 2000 IU daily forever. Thx

## 2019-01-22 NOTE — Progress Notes (Signed)
Called pt, no answer, LVMTRC. 

## 2019-01-25 NOTE — Progress Notes (Signed)
Pt aware, mentioned she had seen the results through mychart.

## 2019-01-28 ENCOUNTER — Other Ambulatory Visit: Payer: Self-pay

## 2019-01-28 ENCOUNTER — Encounter: Payer: Self-pay | Admitting: Family Medicine

## 2019-01-28 ENCOUNTER — Ambulatory Visit: Payer: BC Managed Care – PPO | Admitting: Family Medicine

## 2019-01-28 VITALS — BP 110/70 | HR 81 | Temp 97.5°F | Resp 16 | Ht 63.0 in | Wt 164.8 lb

## 2019-01-28 DIAGNOSIS — E782 Mixed hyperlipidemia: Secondary | ICD-10-CM

## 2019-01-28 DIAGNOSIS — M25559 Pain in unspecified hip: Secondary | ICD-10-CM

## 2019-01-28 DIAGNOSIS — E559 Vitamin D deficiency, unspecified: Secondary | ICD-10-CM

## 2019-01-28 DIAGNOSIS — K644 Residual hemorrhoidal skin tags: Secondary | ICD-10-CM

## 2019-01-28 DIAGNOSIS — E663 Overweight: Secondary | ICD-10-CM

## 2019-01-28 DIAGNOSIS — L301 Dyshidrosis [pompholyx]: Secondary | ICD-10-CM

## 2019-01-28 NOTE — Progress Notes (Signed)
Name: Sarah Carrillo   MRN: [id number]    DOB: [birth year]   Date:01/28/2019       Progress Note  Subjective  Chief Complaint  Chief Complaint  Patient presents with  . Follow-up    HPI Pt presents today for her 3 month follow-up   Overweight: Has not been doing yoga recently due to Covid. Enjoys cooking and eating fresh vegetables but is trying to cut back on fast foods. Works late at night at home and is attempting to cut back.  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  She is trying to exercise by doing yoga 1-2 days a week. 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    Hemmorhoids: recently with bright red blood in stools last month at this point is is resolved at this point with no new episodes. She has used preperation H. Discussed referral to GI, she declines for now, will wait to see if she develops symptoms again. No dark and tarry stools, abdominal pain, or family history of colorectal cancer.   Vitamin D deficiency: taking her vitamin D 3-4 times weekly but has still yielded positive results and has noticed her energy level has greatly improved. She attempts to walk weekly and sits close to a window for some sunlight. Vit D, 25-hydroxy status was 26.2  Dry Skin/Abnormal Nevus: She's has seen a  dermatologist since last visit for nevus on the right posterior shoulder that has been present for several years. She was informed that no intervention was necessary at this time. Previous history of dyshydrotic eczema on bilateral feet but no complications without dry scaling skin on feet.    Arthalgias: better with vitamin D improvement.  She states she spoke with her mom and there is no family history of RA.  LDL and HDL abnormal: Due for Lipid recheck.  No chest pain, shortness of breath, myalgias, family history of MI younger than 44yo.  Flu shot  is up to-date.  PAP smear is up to date  Mammogram up to date  Patient Active Problem List   Diagnosis Date Noted  . Right hip pain 05/01/2018  . Chronic bilateral low back pain with bilateral sciatica 05/01/2018  . Overweight (BMI 25.0-29.9) 05/01/2018  . Family history of rheumatoid arthritis 05/01/2018  . Neck pain 05/01/2018  . Trapezius strain 05/01/2018  . Dyshidrotic eczema 05/01/2018  . B12 deficiency   . Vitamin D deficiency 12/25/2016    No past surgical history on file.  Family History  Problem Relation Age of Onset  . Diabetes Mother   . Rheum arthritis Mother   . Hypertension Mother   . Hyperlipidemia Mother   . Diabetes Father   . Hyperlipidemia Sister   . Hyperlipidemia Sister   . Breast cancer Neg Hx     Social History   Socioeconomic History  . Marital status: Married    Spouse name: Marzetta Board  . Number of children: 2  . Years of education: Not on file  . Highest education level: Not on file  Occupational History  . Not on file  Social Needs  . Financial resource strain: Not hard at all  . Food insecurity    Worry: Never true    Inability: Never true  . Transportation needs    Medical: No    Non-medical: No  Tobacco Use  .  Smoking status: Never Smoker  . Smokeless tobacco: Never Used  Substance and Sexual Activity  . Alcohol use: Yes    Comment: occasional  . Drug use: No  . Sexual activity: Yes    Partners: Male    Birth control/protection: None  Lifestyle  . Physical activity    Days per week: 1 day    Minutes per session: 30 min  . Stress: Not at all  Relationships  . Social connections    Talks on phone: More than three times a week    Gets together: More than three times a week    Attends religious service: 1 to 4 times per year    Active member of club or organization: No    Attends meetings of clubs or organizations: Never    Relationship status: Married  . Intimate partner violence    Fear of current or ex partner: No     Emotionally abused: No    Physically abused: No    Forced sexual activity: No  Other Topics Concern  . Not on file  Social History Narrative   Married - Husband's name is Erline Levine; Son is Chase (44yo), Daughter Luvenia Starch (44) in college.    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     Current Outpatient Medications:  .  cholecalciferol (VITAMIN D3) 25 MCG (1000 UT) tablet, Take 1,000 Units by mouth daily., Disp: , Rfl:   Allergies  Allergen Reactions  . Penicillin G Other (See Comments)  . Sulfa Antibiotics Rash    I personally reviewed active problem list, medication list, allergies, health maintenance, notes from last encounter, lab results with the patient/caregiver today.   Review of Systems  Constitutional: Negative.   HENT: Negative.   Eyes: Negative.   Respiratory: Negative.   Cardiovascular: Negative.  Negative for chest pain.  Gastrointestinal: Negative.   Genitourinary: Negative.   Musculoskeletal: Negative.   Skin: Negative.   Neurological: Negative.   Psychiatric/Behavioral: Negative.   All other systems reviewed and are negative.   Objective  Vitals:   01/28/19 1121  BP: 110/70  Pulse: 81  Resp: 16  Temp: (!) 97.5 F (36.4 C)  TempSrc: Oral  SpO2: 95%  Weight: 74.8 kg  Height: 5\' 3"  (1.6 m)    Body mass index is 29.19 kg/m.  Physical Exam Constitutional:      General: She is not in acute distress.    Appearance: Normal appearance. She is well-developed.  HENT:     Head: Normocephalic and atraumatic.  Neck:     Musculoskeletal: Normal range of motion and neck supple.  Cardiovascular:     Rate and Rhythm: Normal rate and regular rhythm.     Pulses: Normal pulses.     Heart sounds: Normal heart sounds. No murmur. No friction rub. No gallop.   Pulmonary:     Effort: Pulmonary effort is normal. No respiratory distress.     Breath sounds: Normal breath sounds. No stridor. No wheezing or rhonchi.  Musculoskeletal: Normal range of motion.         General: No swelling or tenderness.  Skin:    General: Skin is warm and dry.     Coloration: Skin is not pale.     Findings: No erythema or rash.  Neurological:     General: No focal deficit present.     Mental Status: She is alert and oriented to person, place, and time.  Psychiatric:        Mood and  Affect: Mood normal.        Behavior: Behavior normal.        Thought Content: Thought content normal.        Judgment: Judgment normal.        PHQ2/9: Depression screen [facility] 2/9 01/28/2019 11/26/2018 05/01/2018  Decreased Interest 0 0 0  Down, Depressed, Hopeless 0 0 0  PHQ - 2 Score 0 0 0  Altered sleeping 0 0 0  Tired, decreased energy 0 0 0  Change in appetite 0 0 0  Feeling bad or failure about yourself  0 0 0  Trouble concentrating 0 0 0  Moving slowly or fidgety/restless 0 0 0  Suicidal thoughts 0 0 0  PHQ-9 Score 0 0 0  Difficult doing work/chores Not difficult at all Not difficult at all Not difficult at all   PHQ-2/9 Result is negative.    Fall Risk: Fall Risk  01/28/2019 11/26/2018 05/01/2018  Falls in the past year? 0 0 0  Number falls in past yr: 0 0 0  Injury with Fall? 0 0 0  Follow up Falls evaluation completed - Falls evaluation completed    Assessment & Plan  1. Overweight (BMI 25.0-29.9) - Discussed importance of 150 minutes of physical activity weekly, eat two servings of fish weekly, eat one serving of tree nuts ( cashews, pistachios, pecans, almonds.Marland Kitchen) every other day, eat 6 servings of fruit/vegetables daily and drink plenty of water and avoid sweet beverages.   2. External hemorrhoid, bleeding -If reoccurrence should happen, utilize hydrocortisone suppositories. If further complications or hemorrhoids become more frequent, please follow up for consult with gastroenterology.   3. Vitamin D deficiency -Continue cholecalciferol (VITAMIN D3) 25 MCG (1000 UT) tablet, Take 1,000 Units by mouth daily.  4. Dyshidrotic eczema - Resolved; Monitor skin  for any changes along with use of moisturizers as needed.   5. Arthralgia of hip, unspecified laterality -Continue cholecalciferol (VITAMIN D3) 25 MCG (1000 UT) tablet, Take 1,000 Units by mouth daily. - Improved with supplementation.  6. Mixed hyperlipidemia - Lipid Profile -Diet low in cholesterol   Face-to-face time with patient was more than 25 minutes, >50% time spent counseling and coordination of care

## 2019-01-28 NOTE — Patient Instructions (Signed)
Hydrocortisone Suppositories.

## 2019-01-29 LAB — LIPID PANEL
Cholesterol: 249 mg/dL — ABNORMAL HIGH (ref <200)
HDL: 45 mg/dL — ABNORMAL LOW (ref >=50)
LDL Cholesterol (Calc): 166 mg/dL (calc) — ABNORMAL HIGH
Non-HDL Cholesterol (Calc): 204 mg/dL (calc) — ABNORMAL HIGH (ref <130)
Total CHOL/HDL Ratio: 5.5 (calc) — ABNORMAL HIGH (ref <5.0)
Triglycerides: 227 mg/dL — ABNORMAL HIGH (ref <150)

## 2019-06-10 ENCOUNTER — Ambulatory Visit: Payer: Self-pay | Admitting: *Deleted

## 2019-06-10 NOTE — Telephone Encounter (Signed)
Pt called with complaints of abdominal pain that is constant,and rated 6-7 out of 10; the pain radiates to her back; she states is the last day of her menstrual period, but she normally does not cramps during a period; she says it feels like a heaviness in her stomach; she says her abdomen is tender to touch; her temp is 96.6; recommendations made per nurse triage protocol; she verbalized understanding; the pt sees Raelyn Ensign, [facility] ; will route to office for notification.  Reason for Disposition . Patient sounds very sick or weak to the triager  Answer Assessment - Initial Assessment Questions 1. LOCATION: "Where does it hurt?"      Lower abdomen 2. RADIATION: "Does the pain shoot anywhere else?" (e.g., chest, back)    back 3. ONSET: "When did the pain begin?" (e.g., minutes, hours or days ago)     06/09/19 around 2100; took Midol 4. SUDDEN: "Gradual or sudden onset?"    gradually 5. PATTERN "Does the pain come and go, or is it constant?"    - If constant: "Is it getting better, staying the same, or worsening?"      (Note: Constant means the pain never goes away completely; most serious pain is constant and it progresses)     - If intermittent: "How long does it last?" "Do you have pain now?"     (Note: Intermittent means the pain goes away completely between bouts)     constant 6. SEVERITY: "How bad is the pain?"  (e.g., Scale 1-10; mild, moderate, or severe)   - MILD (1-3): doesn't interfere with normal activities, abdomen soft and not tender to touch    - MODERATE (4-7): interferes with normal activities or awakens from sleep, tender to touch    - SEVERE (8-10): excruciating pain, doubled over, unable to do any normal activities     6-7 out of 10 7. RECURRENT SYMPTOM: "Have you ever had this type of abdominal pain before?" If so, ask: "When was the last time?" and "What happened that time?"     Yes; after having intercourse 8. CAUSE: "What do you think is causing the  abdominal pain?"     Not sure 9. RELIEVING/AGGRAVATING FACTORS: "What makes it better or worse?" (e.g., movement, antacids, bowel movement)   Advil has helped with pain 10. OTHER SYMPTOMS: "Has there been any vomiting, diarrhea, constipation, or urine problems?"      Uncomfortable to walk 11. PREGNANCY: "Is there any chance you are pregnant?" "When was your last menstrual period?"       06/06/19  Protocols used: ABDOMINAL PAIN - [facility]

## 2019-06-10 NOTE — Telephone Encounter (Signed)
Patient notified, she stated she will be checked at [facility]

## 2019-06-15 ENCOUNTER — Other Ambulatory Visit: Payer: Self-pay

## 2019-06-15 ENCOUNTER — Encounter: Payer: Self-pay | Admitting: Obstetrics and Gynecology

## 2019-06-15 ENCOUNTER — Ambulatory Visit (INDEPENDENT_AMBULATORY_CARE_PROVIDER_SITE_OTHER): Payer: 59

## 2019-06-15 ENCOUNTER — Ambulatory Visit (INDEPENDENT_AMBULATORY_CARE_PROVIDER_SITE_OTHER): Payer: 59 | Admitting: Obstetrics and Gynecology

## 2019-06-15 VITALS — BP 110/80 | Ht 63.0 in | Wt 165.0 lb

## 2019-06-15 DIAGNOSIS — D251 Intramural leiomyoma of uterus: Secondary | ICD-10-CM

## 2019-06-15 DIAGNOSIS — R102 Pelvic and perineal pain: Secondary | ICD-10-CM

## 2019-06-15 DIAGNOSIS — D219 Benign neoplasm of connective and other soft tissue, unspecified: Secondary | ICD-10-CM

## 2019-06-15 NOTE — Patient Instructions (Signed)
I value your feedback and entrusting us with your care. If you get a [facility] patient survey, I would appreciate you taking the time to let us know about your experience today. Thank you!  As of March 18, 2019, your lab results will be released to your MyChart immediately, before I even have a chance to see them. Please give me time to review them and contact you if there are any abnormalities. Thank you for your patience.  

## 2019-06-15 NOTE — Progress Notes (Signed)
Sarah Hartshorn, FNP   Chief Complaint  Patient presents with  . Pelvic Pain    denies uti sx, entire pelvic area, had some spotting last sunday    HPI:      Sarah Carrillo is a 44 y.o. [id number] who LMP was Patient's last menstrual period was 06/06/2019 (exact date)., presents today for pelvic pain that started at end of her LMP (about 5 days ago). Pain was very crampy and intense for several hrs, hurt to walk; radiated to low back and down legs. Pt took ibup 400 mg and pain persisted, although slightly improved. Pain eventually resolved the next day and then became heaviness and now just a soreness. LMP was normal, except had 1 extra day spotting on Sat (day after severe pain). No GI, urin, vag sx. No fevers. Pt also had 1 day of severe cramping mid cycle this past month after sex (no pain during sex). No postcoital bleeding. Took 2 ibup with sx relief. Sx very unusual for pt. No new partners, using condoms.  Menses are regular every 28-30 days, lasting 5 days, med flow. Dysmenorrhea mild, occurring first 1-2 days of flow. Last annual 10/20.   Past Medical History:  Diagnosis Date  . B12 deficiency   . Menorrhagia   . Vitamin D deficiency     History reviewed. No pertinent surgical history.  Family History  Problem Relation Age of Onset  . Diabetes Mother   . Rheum arthritis Mother   . Hypertension Mother   . Hyperlipidemia Mother   . Diabetes Father   . Hyperlipidemia Sister   . Hyperlipidemia Sister   . Breast cancer Neg Hx     Social History   Socioeconomic History  . Marital status: Married    Spouse name: Marzetta Board  . Number of children: 2  . Years of education: Not on file  . Highest education level: Not on file  Occupational History  . Not on file  Tobacco Use  . Smoking status: Never Smoker  . Smokeless tobacco: Never Used  Substance and Sexual Activity  . Alcohol use: Yes    Comment: occasional  . Drug use: No  . Sexual activity: Yes    Partners:  Male    Birth control/protection: None  Other Topics Concern  . Not on file  Social History Narrative   Married - Husband's name is Erline Levine; Son is Chase (54yo), Daughter Luvenia Starch (32) in college.    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   Social Determinants of Health   Financial Resource Strain:   . Difficulty of Paying Living Expenses: Not on file  Food Insecurity:   . Worried About Charity fundraiser in the Last Year: Not on file  . Ran Out of Food in the Last Year: Not on file  Transportation Needs:   . Lack of Transportation (Medical): Not on file  . Lack of Transportation (Non-Medical): Not on file  Physical Activity:   . Days of Exercise per Week: Not on file  . Minutes of Exercise per Session: Not on file  Stress:   . Feeling of Stress : Not on file  Social Connections:   . Frequency of Communication with Friends and Family: Not on file  . Frequency of Social Gatherings with Friends and Family: Not on file  . Attends Religious Services: Not on file  . Active Member of Clubs or Organizations: Not on file  . Attends Archivist Meetings:  Not on file  . Marital Status: Not on file  Intimate Partner Violence:   . Fear of Current or Ex-Partner: Not on file  . Emotionally Abused: Not on file  . Physically Abused: Not on file  . Sexually Abused: Not on file    Outpatient Medications Prior to Visit  Medication Sig Dispense Refill  . cholecalciferol (VITAMIN D3) 25 MCG (1000 UT) tablet Take 1,000 Units by mouth daily.     No facility-administered medications prior to visit.      ROS:  Review of Systems  Constitutional: Negative for fatigue, fever and unexpected weight change.  Respiratory: Negative for cough, shortness of breath and wheezing.   Cardiovascular: Negative for chest pain, palpitations and leg swelling.  Gastrointestinal: Negative for blood in stool, constipation, diarrhea, nausea and vomiting.  Endocrine: Negative for cold intolerance,  heat intolerance and polyuria.  Genitourinary: Positive for pelvic pain. Negative for dyspareunia, dysuria, flank pain, frequency, genital sores, hematuria, menstrual problem, urgency, vaginal bleeding, vaginal discharge and vaginal pain.  Musculoskeletal: Negative for back pain, joint swelling and myalgias.  Skin: Negative for rash.  Neurological: Negative for dizziness, syncope, light-headedness, numbness and headaches.  Hematological: Negative for adenopathy.  Psychiatric/Behavioral: Negative for agitation, confusion, sleep disturbance and suicidal ideas. The patient is not nervous/anxious.   BREAST: No symptoms   OBJECTIVE:   Vitals:  BP 110/80   Ht 5\' 3"  (1.6 m)   Wt 165 lb (74.8 kg)   LMP 06/06/2019 (Exact Date)   BMI 29.23 kg/m   Physical Exam Vitals reviewed.  Constitutional:      Appearance: She is well-developed.  Pulmonary:     Effort: Pulmonary effort is normal.  Abdominal:     Palpations: Abdomen is soft.     Tenderness: There is abdominal tenderness in the suprapubic area. There is no guarding or rebound.  Genitourinary:    General: Normal vulva.     Pubic Area: No rash.      Labia:        Right: No rash, tenderness or lesion.        Left: No rash, tenderness or lesion.      Vagina: Normal. No vaginal discharge, erythema or tenderness.     Cervix: Normal.     Uterus: Normal. Enlarged. Not tender.      Adnexa: Right adnexa normal and left adnexa normal.       Right: No mass or tenderness.         Left: No mass or tenderness.    Musculoskeletal:        General: Normal range of motion.     Cervical back: Normal range of motion.  Skin:    General: Skin is warm and dry.  Neurological:     General: No focal deficit present.     Mental Status: She is alert and oriented to person, place, and time.  Psychiatric:        Mood and Affect: Mood normal.        Behavior: Behavior normal.        Thought Content: Thought content normal.        Judgment: Judgment  normal.    Results:  ULTRASOUND REPORT  Location: [facility]  Date of Service: 06/15/2019    Indications:Pelvic Pain Findings:  The uterus is anteverted and measures 15.4 x 6.9 x 6.5 cm. Echo texture is heterogenous with evidence of focal masses. Within the uterus are multiple suspected fibroids measuring: Fibroid 1: 76.8 x 69.4 x 62.2  mm pedunculated vs subserosal anterior/fundal Fibroid 2:45.6 x 28.6 x 38.4 mm pedunculated right Fibroid 3: 27.8 x 20.2 x 25.7 mm intramural posterior Fibroid 4: 25.4 x 16.6 x 22.0 mm intramural right Fibroid 5: 26.7 x 22.4 x 28.8 mm intramural left  The Endometrium measures 4.6 mm. The endometrium was clearly visualized and appears normal.   Right Ovary measures 2.7 x 1.8 x 1.6  cm. It is normal in appearance. Left Ovary measures 3.8 x 1.3 x 1.1  cm. It is normal in appearance. Survey of the adnexa demonstrates no adnexal masses. There is no free fluid in the cul de sac.  Impression: 1. There are at least 5 uterine fibroids.  2. The largest is anterior to the fundus and measures 7.7 cm with blood flow.  3. Norma appearing endometrium. 4. Normal appearing cervix and ovaries.   Recommendations: 1.Clinical correlation with the patient's History and Physical Exam.  Gweneth Dimitri, RT  Assessment/Plan: Pelvic pain - Plan: US PELVIC COMPLETE WITH TRANSVAGINAL, Sx improved. GYN u/s with 5 leio, neg ovaries. Question ruptured ovar cyst a couple wks ago vs leio vs other. Since sx gone now, will follow sx and cycles for now. Menses are normal and tolerable. Pt to f/u prn.   Leiomyoma--will cont to follow sx prn.     Return for 4:30 GYN u/s today--[facility] to call pt.  Tassie Pollett B. Chlora Mcbain, PA-C 06/16/2019 11:35 AM

## 2019-06-16 ENCOUNTER — Other Ambulatory Visit: Payer: 59

## 2019-06-16 DIAGNOSIS — D219 Benign neoplasm of connective and other soft tissue, unspecified: Secondary | ICD-10-CM | POA: Insufficient documentation

## 2019-07-02 ENCOUNTER — Ambulatory Visit: Payer: 59 | Attending: Internal Medicine

## 2019-07-02 DIAGNOSIS — Z23 Encounter for immunization: Secondary | ICD-10-CM

## 2019-07-02 NOTE — Progress Notes (Signed)
   Covid-19 Vaccination Clinic  Name:  Sarah Carrillo    MRN: [id number] DOB: [birth year]  07/02/2019  [name] was observed post Covid-19 immunization for 15 minutes without incident. She was provided with Vaccine Information Sheet and instruction to access the V-Safe system.   [name] was instructed to call 911 with any severe reactions post vaccine: Marland Kitchen Difficulty breathing  . Swelling of face and throat  . A fast heartbeat  . A bad rash all over body  . Dizziness and weakness   Immunizations Administered    Name Date Dose VIS Date Route   Pfizer COVID-19 Vaccine 07/02/2019 12:12 PM 0.3 mL 03/19/2019 Intramuscular   Manufacturer: [place]   Lot: [id number]   [place]: [id number]

## 2019-07-27 ENCOUNTER — Ambulatory Visit: Payer: 59 | Attending: Internal Medicine

## 2019-07-27 DIAGNOSIS — Z23 Encounter for immunization: Secondary | ICD-10-CM

## 2019-07-27 NOTE — Progress Notes (Signed)
   Covid-19 Vaccination Clinic  Name:  Sarah Carrillo    MRN: [id number] DOB: 1975/09/12  07/27/2019  [name] was observed post Covid-19 immunization for 15 minutes without incident. She was provided with Vaccine Information Sheet and instruction to access the V-Safe system.   [name] was instructed to call 911 with any severe reactions post vaccine: Marland Kitchen Difficulty breathing  . Swelling of face and throat  . A fast heartbeat  . A bad rash all over body  . Dizziness and weakness   Immunizations Administered    Name Date Dose VIS Date Route   Pfizer COVID-19 Vaccine 07/27/2019  9:47 AM 0.3 mL 06/02/2018 Intramuscular   Manufacturer: [place]   Lot: [id number]   [place]: [id number]

## 2019-08-21 ENCOUNTER — Ambulatory Visit: Payer: 59

## 2019-11-12 DIAGNOSIS — Z20822 Contact with and (suspected) exposure to covid-19: Secondary | ICD-10-CM | POA: Diagnosis not present

## 2020-01-25 ENCOUNTER — Ambulatory Visit: Payer: Self-pay | Admitting: Internal Medicine

## 2020-01-25 NOTE — Progress Notes (Signed)
Patient ID: Sarah Carrillo, female    DOB: 18-Sep-1975, 44 y.o.   MRN: [id number]  PCP: Hubbard Hartshorn, FNP  Chief Complaint  Patient presents with  . Form Completion    Subjective:   Sarah Carrillo is a 44 y.o. female, presents to clinic with CC of the following:  Chief Complaint  Patient presents with  . Form Completion    HPI:  Patient is a 44 year old female former patient of Raelyn Ensign Last visit to [facility] was in October 2020 Follow-up by Raquel Sarna after labs obtained at that visit included the following:  Your cholesterol is much higher than last time - your LDL is now 166, and your HDL is 45 which is a bit low. Work on diet and exercise like we discussed. I'm including some information below. No need for medication at this point.   Follows up today with a form to be completed. Father owned some land in [place], and has to get form completed. The form basically verifies that she is alive presently. She has no specific medical complaints today, and has a follow-up appointment scheduled in December She noted she is heading to [facility] tomorrow, and grew up there, and we shared some foods enjoyed in [place] that she plans to get when she is there.  Hyperlipidemia  Medication regimen-none  Lab Results  Component Value Date   CHOL 249 (H) 01/28/2019   HDL 45 (L) 01/28/2019   LDLCALC 166 (H) 01/28/2019   TRIG 227 (H) 01/28/2019   CHOLHDL 5.5 (H) 01/28/2019    Overweight:  Weight has remained stable in the recent past   Wt Readings from Last 3 Encounters:  01/26/20 163 lb 1.6 oz (74 kg)  06/15/19 165 lb (74.8 kg)  01/28/19 164 lb 12.8 oz (74.8 kg)          Patient Active Problem List   Diagnosis Date Noted  . Leiomyoma 06/16/2019  . Right hip pain 05/01/2018  . Chronic bilateral low back pain with bilateral sciatica 05/01/2018  . Overweight (BMI 25.0-29.9) 05/01/2018  . Family history of rheumatoid arthritis 05/01/2018  . Neck pain 05/01/2018  .  Trapezius strain 05/01/2018  . Dyshidrotic eczema 05/01/2018  . B12 deficiency   . Vitamin D deficiency 12/25/2016      Current Outpatient Medications:  .  cholecalciferol (VITAMIN D3) 25 MCG (1000 UT) tablet, Take 1,000 Units by mouth daily., Disp: , Rfl:    Allergies  Allergen Reactions  . Penicillin G Other (See Comments)  . Sulfa Antibiotics Rash     History reviewed. No pertinent surgical history.   Family History  Problem Relation Age of Onset  . Diabetes Mother   . Rheum arthritis Mother   . Hypertension Mother   . Hyperlipidemia Mother   . Diabetes Father   . Hyperlipidemia Sister   . Hyperlipidemia Sister   . Breast cancer Neg Hx      Social History   Tobacco Use  . Smoking status: Never Smoker  . Smokeless tobacco: Never Used  Substance Use Topics  . Alcohol use: Yes    Comment: occasional    With staff assistance, above reviewed with the patient today.  ROS: As per HPI, otherwise no specific complaints on a limited and focused system review   No results found for this or any previous visit (from the past 72 hour(s)).   PHQ2/9: Depression screen [facility] 2/9 01/26/2020 01/28/2019 11/26/2018 05/01/2018  Decreased Interest 0 0 0 0  Down,  Depressed, Hopeless 0 0 0 0  PHQ - 2 Score 0 0 0 0  Altered sleeping - 0 0 0  Tired, decreased energy - 0 0 0  Change in appetite - 0 0 0  Feeling bad or failure about yourself  - 0 0 0  Trouble concentrating - 0 0 0  Moving slowly or fidgety/restless - 0 0 0  Suicidal thoughts - 0 0 0  PHQ-9 Score - 0 0 0  Difficult doing work/chores - Not difficult at all Not difficult at all Not difficult at all   PHQ-2/9 Result is neg  Fall Risk: Fall Risk  01/26/2020 01/28/2019 11/26/2018 05/01/2018  Falls in the past year? 0 0 0 0  Number falls in past yr: 0 0 0 0  Injury with Fall? 0 0 0 0  Follow up - Falls evaluation completed - Falls evaluation completed      Objective:   There were no vitals filed for this  visit.  There is no height or weight on file to calculate BMI.  Physical Exam   NAD, masked, very pleasant HEENT - [place]/AT, sclera anicteric, Neuro/psychiatric - affect was not flat, appropriate with conversation  Alert and oriented with normal speech  Results for orders placed or performed in visit on 01/28/19  Lipid Profile  Result Value Ref Range   Cholesterol 249 (H) <200 mg/dL   HDL 45 (L) > OR = 50 mg/dL   Triglycerides 227 (H) <150 mg/dL   LDL Cholesterol (Calc) 166 (H) mg/dL (calc)   Total CHOL/HDL Ratio 5.5 (H) <5.0 (calc)   Non-HDL Cholesterol (Calc) 204 (H) <130 mg/dL (calc)       Assessment & Plan:   1. Form completion The form was completed and copy made for the chart. It did require signature across her attached photo.  2. Mixed hyperlipidemia I did note her history of this and she noted she has been trying to watch her diet over the past year.  3. Overweight (BMI 25.0-29.9) Her weight has been stable in the past year.  4. Need for immunization against influenza  - Flu Vaccine QUAD 36+ mos IM   The form was completed for her today, and she has a follow-up planned in December to have a more complete physical evaluation and follow-up. Can follow-up sooner as needed.      Towanda Malkin, MD 01/26/20 10:24 AM

## 2020-01-26 ENCOUNTER — Ambulatory Visit: Payer: BC Managed Care – PPO | Admitting: Internal Medicine

## 2020-01-26 ENCOUNTER — Other Ambulatory Visit: Payer: Self-pay

## 2020-01-26 ENCOUNTER — Encounter: Payer: Self-pay | Admitting: Internal Medicine

## 2020-01-26 VITALS — BP 110/76 | HR 90 | Temp 98.5°F | Resp 16 | Ht 63.0 in | Wt 163.1 lb

## 2020-01-26 DIAGNOSIS — E663 Overweight: Secondary | ICD-10-CM | POA: Diagnosis not present

## 2020-01-26 DIAGNOSIS — E782 Mixed hyperlipidemia: Secondary | ICD-10-CM | POA: Diagnosis not present

## 2020-01-26 DIAGNOSIS — Z23 Encounter for immunization: Secondary | ICD-10-CM | POA: Diagnosis not present

## 2020-03-09 ENCOUNTER — Encounter: Payer: Self-pay | Admitting: Family Medicine

## 2020-03-09 ENCOUNTER — Ambulatory Visit (INDEPENDENT_AMBULATORY_CARE_PROVIDER_SITE_OTHER): Payer: BC Managed Care – PPO | Admitting: Family Medicine

## 2020-03-09 ENCOUNTER — Other Ambulatory Visit: Payer: Self-pay

## 2020-03-09 VITALS — BP 118/70 | HR 78 | Temp 98.5°F | Resp 18 | Ht 63.0 in | Wt 160.5 lb

## 2020-03-09 DIAGNOSIS — Z1211 Encounter for screening for malignant neoplasm of colon: Secondary | ICD-10-CM

## 2020-03-09 DIAGNOSIS — E782 Mixed hyperlipidemia: Secondary | ICD-10-CM

## 2020-03-09 DIAGNOSIS — M542 Cervicalgia: Secondary | ICD-10-CM

## 2020-03-09 DIAGNOSIS — K644 Residual hemorrhoidal skin tags: Secondary | ICD-10-CM

## 2020-03-09 DIAGNOSIS — E559 Vitamin D deficiency, unspecified: Secondary | ICD-10-CM

## 2020-03-09 DIAGNOSIS — Z0000 Encounter for general adult medical examination without abnormal findings: Secondary | ICD-10-CM

## 2020-03-09 DIAGNOSIS — Z1231 Encounter for screening mammogram for malignant neoplasm of breast: Secondary | ICD-10-CM

## 2020-03-09 NOTE — Progress Notes (Signed)
Patient: Sarah Carrillo, Female    DOB: 22-Jan-1976, 44 y.o.   MRN: [id number] Hubbard Hartshorn, FNP Visit Date: 03/09/2020  Today's Provider: Delsa Grana, PA-C   Chief Complaint  Patient presents with  . Annual Exam   Subjective:   Annual physical exam:  Sarah Carrillo is a 44 y.o. 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  Diet/nutrition:   Tries to be healthy  Sleep:  Sleeps well - watches her fit bit gets at least 7 hours of sleep   Left hand brace left hand pain and neck pain when she believes has been ongoing for a few months seems to be related to stress and work  Hyperlipidemia: Currently treated with-diet lifestyle efforts, reviewed ASCVD risk score Last Lipids: Total cholesterol was 249, HDL was 45 and LDL was 166 - Denies: Chest pain, shortness of breath, myalgias, claudication   USPSTF grade A and B recommendations - reviewed and addressed today  Depression:  Phq 9 completed today by patient, was reviewed by me with patient in the room PHQ score is neg, pt feels good PHQ 2/9 Scores 03/09/2020 01/26/2020 01/28/2019 11/26/2018  PHQ - 2 Score 0 0 0 0  PHQ- 9 Score - - 0 0   Depression screen [facility] 2/9 03/09/2020 01/26/2020 01/28/2019 11/26/2018 05/01/2018  Decreased Interest 0 0 0 0 0  Down, Depressed, Hopeless 0 0 0 0 0  PHQ - 2 Score 0 0 0 0 0  Altered sleeping - - 0 0 0  Tired, decreased energy - - 0 0 0  Change in appetite - - 0 0 0  Feeling bad or failure about yourself  - - 0 0 0  Trouble concentrating - - 0 0 0  Moving slowly or fidgety/restless - - 0 0 0  Suicidal thoughts - - 0 0 0  PHQ-9 Score - - 0 0 0  Difficult doing work/chores - - Not difficult at all Not difficult at all Not difficult at all    Alcohol screening:   Office Visit from 03/09/2020 in [facility]  AUDIT-C  Score 1      Immunizations and Health Maintenance: Health Maintenance  Topic Date Due  . MAMMOGRAM  01/07/2020  . PAP SMEAR-Modifier  12/25/2020  . TETANUS/TDAP  05/01/2028  . INFLUENZA VACCINE  Completed  . COVID-19 Vaccine  Completed  . Hepatitis C Screening  Completed  . HIV Screening  Completed     Hep C Screening: Done in the past  STD testing and prevention (HIV/chl/gon/syphilis):  see above, no additional testing desired by pt today, completed in the past, monogamous  Intimate partner violence: Feels safe  Sexual History/Pain during Intercourse: Married no complaints or concerns at this time  Menstrual History/LMP/Abnormal Bleeding: regular Patient's last menstrual period was 02/28/2020.  Incontinence Symptoms: Denies  Breast cancer: Due for mammogram Last Mammogram: *see HM list above BRCA gene screening: No known family history  Cervical cancer screening:   OBGYN did everything last year - [facility]  Pt denies family hx of cancers - breast, ovarian, uterine, colon:     Osteoporosis:   Discussion on osteoporosis per age, including high calcium and vitamin D supplementation, weight bearing exercises  Skin cancer:  Hx of skin CA -  NO saw a dermatologist in the past  for checking moles  Discussed atypical lesions   Colorectal cancer:   Colonoscopy is due at age 73 Discussed concerning signs and sx of CRC, pt denies melena, hematochezia, change in bowel movements  Lung cancer:   Low Dose CT Chest recommended if Age 24-80 years, 30 pack-year currently smoking OR have quit w/in 15years. Patient does not qualify.    Social History   Tobacco Use  . Smoking status: Never Smoker  . Smokeless tobacco: Never Used  Vaping Use  . Vaping Use: Never used  Substance Use Topics  . Alcohol use: Yes    Comment: occasional  . Drug use: No       Office Visit from 03/09/2020 in [facility]  AUDIT-C Score 1      Family History  Problem  Relation Age of Onset  . Diabetes Mother   . Rheum arthritis Mother   . Hypertension Mother   . Hyperlipidemia Mother   . Diabetes Father   . Hyperlipidemia Sister   . Hyperlipidemia Sister   . Breast cancer Neg Hx      Blood pressure/Hypertension: BP Readings from Last 3 Encounters:  03/09/20 118/70  01/26/20 110/76  06/15/19 110/80    Weight/Obesity: Wt Readings from Last 3 Encounters:  03/09/20 160 lb 8 oz (72.8 kg)  01/26/20 163 lb 1.6 oz (74 kg)  06/15/19 165 lb (74.8 kg)   BMI Readings from Last 3 Encounters:  03/09/20 28.43 kg/m  01/26/20 28.89 kg/m  06/15/19 29.23 kg/m     Lipids:  Lab Results  Component Value Date   CHOL 249 (H) 01/28/2019   CHOL 191 05/01/2018   CHOL 192 12/25/2016   Lab Results  Component Value Date   HDL 45 (L) 01/28/2019   HDL 46 (L) 05/01/2018   HDL 52 12/25/2016   Lab Results  Component Value Date   LDLCALC 166 (H) 01/28/2019   LDLCALC 123 (H) 05/01/2018   LDLCALC 112 (H) 12/25/2016   Lab Results  Component Value Date   TRIG 227 (H) 01/28/2019   TRIG 108 05/01/2018   TRIG 141 12/25/2016   Lab Results  Component Value Date   CHOLHDL 5.5 (H) 01/28/2019   CHOLHDL 4.2 05/01/2018   CHOLHDL 3.7 12/25/2016   No results found for: LDLDIRECT Based on the results of lipid panel his/her cardiovascular risk factor ( using [place] )  in the next 10 years is: The 10-year ASCVD risk score Mikey Bussing DC Brooke Bonito., et al., 2013) is: 1.4%   Values used to calculate the score:     Age: 44 years     Sex: Female     Is Non-Hispanic African American: No     Diabetic: No     Tobacco smoker: No     Systolic Blood Pressure: 174 mmHg     Is BP treated: No     HDL Cholesterol: 45 mg/dL     Total Cholesterol: 249 mg/dL Glucose:  Glucose  Date Value Ref Range Status  01/09/2013 156 (H) 65 - 99 mg/dL Final   Glucose, Bld  Date Value Ref Range Status  05/01/2018 85 65 - 99 mg/dL Final    Comment:    .            Fasting reference  interval .    Hypertension: BP Readings from Last 3 Encounters:  03/09/20 118/70  01/26/20 110/76  06/15/19 110/80   Obesity: Wt Readings from Last 3 Encounters:  03/09/20 160 lb 8 oz (72.8  kg)  01/26/20 163 lb 1.6 oz (74 kg)  06/15/19 165 lb (74.8 kg)   BMI Readings from Last 3 Encounters:  03/09/20 28.43 kg/m  01/26/20 28.89 kg/m  06/15/19 29.23 kg/m    Advanced Care Planning:  A voluntary discussion about advance care planning including the explanation and discussion of advance directives.   Discussed health care proxy and Living will, and the patient was able to identify a health care proxy as her husband.   Patient does not have a living will at present time.   Social History      She        Social History   Socioeconomic History  . Marital status: Married    Spouse name: Marzetta Board  . Number of children: 2  . Years of education: Not on file  . Highest education level: Not on file  Occupational History  . Not on file  Tobacco Use  . Smoking status: Never Smoker  . Smokeless tobacco: Never Used  Vaping Use  . Vaping Use: Never used  Substance and Sexual Activity  . Alcohol use: Yes    Comment: occasional  . Drug use: No  . Sexual activity: Yes    Partners: Male    Birth control/protection: None  Other Topics Concern  . Not on file  Social History Narrative   Married - Husband's name is Erline Levine; Son is Chase (38yo), Daughter Luvenia Starch (72) in college.    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   Social Determinants of Health   Financial Resource Strain: Low Risk   . Difficulty of Paying Living Expenses: Not hard at all  Food Insecurity: No Food Insecurity  . Worried About Charity fundraiser in the Last Year: Never true  . Ran Out of Food in the Last Year: Never true  Transportation Needs: No Transportation Needs  . Lack of Transportation (Medical): No  . Lack of Transportation (Non-Medical): No  Physical Activity: Insufficiently Active  . Days of  Exercise per Week: 2 days  . Minutes of Exercise per Session: 30 min  Stress: No Stress Concern Present  . Feeling of Stress : Not at all  Social Connections: Moderately Isolated  . Frequency of Communication with Friends and Family: More than three times a week  . Frequency of Social Gatherings with Friends and Family: More than three times a week  . Attends Religious Services: Never  . Active Member of Clubs or Organizations: No  . Attends Archivist Meetings: Never  . Marital Status: Married    Family History        Family History  Problem Relation Age of Onset  . Diabetes Mother   . Rheum arthritis Mother   . Hypertension Mother   . Hyperlipidemia Mother   . Diabetes Father   . Hyperlipidemia Sister   . Hyperlipidemia Sister   . Breast cancer Neg Hx     Patient Active Problem List   Diagnosis Date Noted  . Mixed hyperlipidemia 01/26/2020  . Leiomyoma 06/16/2019  . Right hip pain 05/01/2018  . Chronic bilateral low back pain with bilateral sciatica 05/01/2018  . Overweight (BMI 25.0-29.9) 05/01/2018  . Family history of rheumatoid arthritis 05/01/2018  . Neck pain 05/01/2018  . Trapezius strain 05/01/2018  . Dyshidrotic eczema 05/01/2018  . B12 deficiency   . Vitamin D deficiency 12/25/2016    No past surgical history on file.   Current Outpatient Medications:  .  cholecalciferol (VITAMIN D3) 25  MCG (1000 UT) tablet, Take 1,000 Units by mouth daily., Disp: , Rfl:  .  loratadine (CLARITIN) 10 MG tablet, Take 10 mg by mouth daily., Disp: , Rfl:   Allergies  Allergen Reactions  . Penicillin G Other (See Comments)  . Sulfa Antibiotics Rash    Patient Care Team: Hubbard Hartshorn, FNP as PCP - General (Family Medicine)  Review of Systems  10 Systems reviewed and are negative for acute change except as noted in the HPI.   I personally reviewed active problem list, medication list, allergies, family history, social history, health maintenance, notes  from last encounter, lab results, imaging with the patient/caregiver today.        Objective:   Vitals:  Vitals:   03/09/20 1016  BP: 118/70  Pulse: 78  Resp: 18  Temp: 98.5 F (36.9 C)  TempSrc: Oral  SpO2: 99%  Weight: 160 lb 8 oz (72.8 kg)  Height: '5\' 3"'  (1.6 m)    Body mass index is 28.43 kg/m.  Physical Exam Vitals and nursing note reviewed.  Constitutional:      General: She is not in acute distress.    Appearance: Normal appearance. She is well-developed. She is not ill-appearing, toxic-appearing or diaphoretic.     Interventions: Face mask in place.  HENT:     Head: Normocephalic and atraumatic.     Right Ear: External ear normal.     Left Ear: External ear normal.  Eyes:     General: Lids are normal. No scleral icterus.       Right eye: No discharge.        Left eye: No discharge.     Conjunctiva/sclera: Conjunctivae normal.  Neck:     Trachea: Phonation normal. No tracheal deviation.  Cardiovascular:     Rate and Rhythm: Normal rate and regular rhythm.     Pulses: Normal pulses.          Radial pulses are 2+ on the right side and 2+ on the left side.       Posterior tibial pulses are 2+ on the right side and 2+ on the left side.     Heart sounds: Normal heart sounds. No murmur heard. No friction rub. No gallop.   Pulmonary:     Effort: Pulmonary effort is normal. No respiratory distress.     Breath sounds: Normal breath sounds. No stridor. No wheezing, rhonchi or rales.  Chest:     Chest wall: No tenderness.  Abdominal:     General: Bowel sounds are normal. There is no distension.     Palpations: Abdomen is soft.     Tenderness: There is no abdominal tenderness. There is no right CVA tenderness, left CVA tenderness or guarding.  Musculoskeletal:     Right lower leg: No edema.     Left lower leg: No edema.     Comments: Left hand brace Neck and cervical spine good range of motion no midline tenderness, mild increased tension in upper trapezius and  paraspinal cervical muscles Good range of motion strength and sensation of bilateral upper extremities  Skin:    General: Skin is warm and dry.     Coloration: Skin is not jaundiced or pale.     Findings: No rash.  Neurological:     Mental Status: She is alert.     Motor: No abnormal muscle tone.     Gait: Gait normal.  Psychiatric:        Mood and Affect: Mood normal.  Speech: Speech normal.        Behavior: Behavior normal.       Fall Risk: Fall Risk  03/09/2020 01/26/2020 01/28/2019 11/26/2018 05/01/2018  Falls in the past year? 0 0 0 0 0  Number falls in past yr: 0 0 0 0 0  Injury with Fall? 0 0 0 0 0  Follow up Falls evaluation completed - Falls evaluation completed - Falls evaluation completed    Functional Status Survey: Is the patient deaf or have difficulty hearing?: No Does the patient have difficulty seeing, even when wearing glasses/contacts?: No Does the patient have difficulty concentrating, remembering, or making decisions?: No Does the patient have difficulty walking or climbing stairs?: No Does the patient have difficulty dressing or bathing?: No Does the patient have difficulty doing errands alone such as visiting a doctor's office or shopping?: No   Assessment & Plan:    CPE completed today  . USPSTF grade A and B recommendations reviewed with patient; age-appropriate recommendations, preventive care, screening tests, etc discussed and encouraged; healthy living encouraged; see AVS for patient education given to patient  . Discussed importance of 150 minutes of physical activity weekly, AHA exercise recommendations given to pt in AVS/handout  . Discussed importance of healthy diet:  eating lean meats and proteins, avoiding trans fats and saturated fats, avoid simple sugars and excessive carbs in diet, eat 6 servings of fruit/vegetables daily and drink plenty of water and avoid sweet beverages.    . Recommended pt to do annual eye exam and routine  dental exams/cleanings  . Depression, alcohol, fall screening completed as documented above and per flowsheets  . Reviewed Health Maintenance: Health Maintenance  Topic Date Due  . MAMMOGRAM  01/07/2020  . PAP SMEAR-Modifier  12/25/2020  . TETANUS/TDAP  05/01/2028  . INFLUENZA VACCINE  Completed  . COVID-19 Vaccine  Completed  . Hepatitis C Screening  Completed  . HIV Screening  Completed    . Immunizations: Immunization History  Administered Date(s) Administered  . Influenza,inj,Quad PF,6+ Mos 05/01/2018, 01/21/2019, 01/26/2020  . PFIZER SARS-COV-2 Vaccination 02/07/2018, 07/02/2019, 07/27/2019  . Tdap 05/01/2018       ICD-10-CM   1. Adult general medical exam  Z00.00 CBC with Differential/Platelet    COMPLETE METABOLIC PANEL WITH GFR    Lipid panel    MM 3D SCREEN BREAST BILATERAL  2. Encounter for screening mammogram for malignant neoplasm of breast  Z12.31 MM 3D SCREEN BREAST BILATERAL  3. Vitamin D deficiency  A45.3 COMPLETE METABOLIC PANEL WITH GFR    VITAMIN D 25 Hydroxy (Vit-D Deficiency, Fractures)  4. Mixed hyperlipidemia  M46.8 COMPLETE METABOLIC PANEL WITH GFR    Lipid panel   Discussed her elevated labs, likely genetic component, discussed indications for starting statin with increased ASCVD risk score or LDL greater 160  5. External hemorrhoid, bleeding  K64.4   6. Neck pain  M54.2 Ambulatory referral to Physical Therapy   Likely related to muscle tension or body mechanics, if not improving do think physical therapy would be very helpful  7. Screening for malignant neoplasm of colon  Z12.11 Ambulatory referral to Gastroenterology        Delsa Grana, PA-C 03/09/20 10:36 AM  [facility]

## 2020-03-09 NOTE — Patient Instructions (Addendum)
Wrist cock up brace - left wrist brace -  Wrist Splint, Adult A wrist splint holds your wrist in a position so it does not move. A splint supports your wrist like a cast, but it is not stiff like a cast (is flexible). You can take it off or make it looser. You may need a wrist splint if you hurt your wrist or have swelling in your wrist. A splint can:  Support your wrist.  Protect your wrist when it is hurt (injured).  Help you not hurt your wrist again.  Make your wrist stay still and not move.  Lessen pain.  Help your wrist heal. It is important to wear your splint as told by your doctor. This helps you make sure that your wrist heals the right way. What are the risks? If you wear your splint too tight or you have a lot of swelling, blood may not be able to go to your wrist or hand. If this happens, you can get a condition called compartment syndrome. It can be dangerous and cause damage that lasts. Symptoms are:  Pain in your wrist that gets worse.  Tingling.  Having no feeling in your wrist or hand. This is called numbness.  Changes in skin color. The skin may look very light (pale) or kind of blue.  Cold fingers. Other risks of wearing a splint may be:  A stiff wrist.  A weak wrist.  Skin irritation that can cause: ? Itching. ? Rash. ? Sores. ? Infection. How to use your wrist splint Your wrist splint should be tight enough to support your wrist. It should not block blood from going to your hand or wrist. Your doctor will tell you how to wear your wrist splint and how long to wear it. Splint wear  Wear the splint as told by your doctor. Only take it off as told by your doctor.  Loosen the splint if your fingers tingle, get numb, or turn cold and blue.  Keep the splint clean.  If the splint is not waterproof: ? Do not let it get wet. ? Cover it with a watertight covering when you take a bath or a shower  Do not stick anything inside the splint to scratch your  skin. Doing that increases your risk of infection.  Check the skin under the splint every time you take it off. Check for any redness or blisters. Tell your doctor about any skin problems. Managing pain, stiffness, and swelling   If directed, put ice on the injured area. ? If you a have a splint that can be taken off, take it off as told by your doctor. ? Put ice in a plastic bag. ? Place a towel between your skin and the bag. ? Leave the ice on for 20 minutes, 2-3 times a day.  Move your fingers often to avoid stiffness and to lessen swelling.  Raise (elevate) the injured area above the level of your heart while you are sitting or lying down. Activity  Return to your normal activities as told by your doctor. Ask your doctor what activities are safe for you.  Do exercises as told by your doctor.  Ask your doctor when it is safe to drive with a splint on your wrist. General instructions  Do not use the injured limb to support (bear) your body weight until your doctor says that you can.  Do not put pressure on any part of the splint until it is fully hardened. This  may take many hours.  Do not use any products that have nicotine or tobacco in them, such as cigarettes and e-cigarettes. If you need help quitting, ask your doctor.  Take over-the-counter and prescription medicines only as told by your doctor.  Keep all follow-up visits as told by your doctor. This is important. Get help if:  You have wrist pain or swelling that does not go away.  The skin around or under your splint gets red, itchy, or moist.  You have chills or fever.  Your splint feels too tight or too loose.  Your splint breaks. Get help right away if:  You have pain that gets worse.  You have tingling and numbness.  You have changes in skin color, including paleness or a bluish color.  Your fingers are cold. Summary  A wrist splint is a flexible device that supports your wrist and keeps your wrist  from moving.  It is important to wear your splint as told by your doctor. This helps to make sure that your wrist heals correctly.  Icing, moving your fingers, and raising your wrist above the level of your heart will help you manage pain, stiffness, and swelling.  Your wrist splint should be tight enough to support your wrist. It should not block your blood supply.  Get help right away if your fingers tingle, get numb, or turn cold and blue. Loosen the splint right away. This information is not intended to replace advice given to you by your health care provider. Make sure you discuss any questions you have with your health care provider. Document Revised: 07/13/2018 Document Reviewed: 06/12/2016 Elsevier Patient Education  2020 [place] for Routine Care of Injuries Many injuries can be cared for with rest, ice, compression, and elevation (RICE therapy). This includes:  Resting the injured part.  Putting ice on the injury.  Putting pressure (compression) on the injury.  Raising the injured part (elevation). Using RICE therapy can help to lessen pain and swelling. Supplies needed:  Ice.  Plastic bag.  Towel.  Elastic bandage.  Pillow or pillows to raise (elevate) your injured body part. How to care for your injury with RICE therapy Rest Limit your normal activities, and try not to use the injured part of your body. You can go back to your normal activities when your doctor says it is okay to do them and you feel okay. Ask your doctor if you should do exercises to help your injury get better. Ice Put ice on the injured area. Do not put ice on your bare skin.  Put ice in a plastic bag.  Place a towel between your skin and the bag.  Leave the ice on for 20 minutes, 2-3 times a day. Use ice on as many days as told by your doctor.  Compression Compression means putting pressure on the injured area. This can be done with an elastic bandage. If an  elastic bandage has been put on your injury:  Do not wrap the bandage too tight. Wrap the bandage more loosely if part of your body away from the bandage is blue, swollen, cold, painful, or loses feeling (gets numb).  Take off the bandage and put it on again. Do this every 3-4 hours or as told by your doctor.  See your doctor if the bandage seems to make your problems worse.  Elevation Elevation means keeping the injured area raised. If you can, raise the injured area above your heart or the  center of your chest. Contact a doctor if:  You keep having pain and swelling.  Your symptoms get worse. Get help right away if:  You have sudden bad pain at your injury or lower than your injury.  You have redness or more swelling around your injury.  You have tingling or numbness at your injury or lower than your injury, and it does not go away when you take off the bandage. Summary  Many injuries can be cared for using rest, ice, compression, and elevation (RICE therapy).  You can go back to your normal activities when you feel okay and your doctor says it is okay.  Put ice on the injured area as told by your doctor.  Get help if your symptoms get worse or if you keep having pain and swelling. This information is not intended to replace advice given to you by your health care provider. Make sure you discuss any questions you have with your health care provider. Document Revised: 12/13/2016 Document Reviewed: 12/13/2016 Elsevier Patient Education  2020 [employer]   Preventive Care 40-9 Years Old, Female Preventive care refers to visits with your health care provider and lifestyle choices that can promote health and wellness. This includes:  A yearly physical exam. This may also be called an annual well check.  Regular dental visits and eye exams.  Immunizations.  Screening for certain conditions.  Healthy lifestyle choices, such as eating a healthy diet, getting regular  exercise, not using drugs or products that contain nicotine and tobacco, and limiting alcohol use. What can I expect for my preventive care visit? Physical exam Your health care provider will check your:  Height and weight. This may be used to calculate body mass index (BMI), which tells if you are at a healthy weight.  Heart rate and blood pressure.  Skin for abnormal spots. Counseling Your health care provider may ask you questions about your:  Alcohol, tobacco, and drug use.  Emotional well-being.  Home and relationship well-being.  Sexual activity.  Eating habits.  Work and work Statistician.  Method of birth control.  Menstrual cycle.  Pregnancy history. What immunizations do I need?  Influenza (flu) vaccine  This is recommended every year. Tetanus, diphtheria, and pertussis (Tdap) vaccine  You may need a Td booster every 10 years. Varicella (chickenpox) vaccine  You may need this if you have not been vaccinated. Zoster (shingles) vaccine  You may need this after age 70. Measles, mumps, and rubella (MMR) vaccine  You may need at least one dose of MMR if you were born in 1957 or later. You may also need a second dose. Pneumococcal conjugate (PCV13) vaccine  You may need this if you have certain conditions and were not previously vaccinated. Pneumococcal polysaccharide (PPSV23) vaccine  You may need one or two doses if you smoke cigarettes or if you have certain conditions. Meningococcal conjugate (MenACWY) vaccine  You may need this if you have certain conditions. Hepatitis A vaccine  You may need this if you have certain conditions or if you travel or work in places where you may be exposed to hepatitis A. Hepatitis B vaccine  You may need this if you have certain conditions or if you travel or work in places where you may be exposed to hepatitis B. Haemophilus influenzae type b (Hib) vaccine  You may need this if you have certain conditions. Human  papillomavirus (HPV) vaccine  If recommended by your health care provider, you may need three doses over  6 months. You may receive vaccines as individual doses or as more than one vaccine together in one shot (combination vaccines). Talk with your health care provider about the risks and benefits of combination vaccines. What tests do I need? Blood tests  Lipid and cholesterol levels. These may be checked every 5 years, or more frequently if you are over 69 years old.  Hepatitis C test.  Hepatitis B test. Screening  Lung cancer screening. You may have this screening every year starting at age 48 if you have a 30-pack-year history of smoking and currently smoke or have quit within the past 15 years.  Colorectal cancer screening. All adults should have this screening starting at age 48 and continuing until age 58. Your health care provider may recommend screening at age 77 if you are at increased risk. You will have tests every 1-10 years, depending on your results and the type of screening test.  Diabetes screening. This is done by checking your blood sugar (glucose) after you have not eaten for a while (fasting). You may have this done every 1-3 years.  Mammogram. This may be done every 1-2 years. Talk with your health care provider about when you should start having regular mammograms. This may depend on whether you have a family history of breast cancer.  BRCA-related cancer screening. This may be done if you have a family history of breast, ovarian, tubal, or peritoneal cancers.  Pelvic exam and Pap test. This may be done every 3 years starting at age 82. Starting at age 29, this may be done every 5 years if you have a Pap test in combination with an HPV test. Other tests  Sexually transmitted disease (STD) testing.  Bone density scan. This is done to screen for osteoporosis. You may have this scan if you are at high risk for osteoporosis. Follow these instructions at home: Eating  and drinking  Eat a diet that includes fresh fruits and vegetables, whole grains, lean protein, and low-fat dairy.  Take vitamin and mineral supplements as recommended by your health care provider.  Do not drink alcohol if: ? Your health care provider tells you not to drink. ? You are pregnant, may be pregnant, or are planning to become pregnant.  If you drink alcohol: ? Limit how much you have to 0-1 drink a day. ? Be aware of how much alcohol is in your drink. In the [place] one drink equals one 12 oz bottle of beer (355 mL), one 5 oz glass of wine (148 mL), or one 1 oz glass of hard liquor (44 mL). Lifestyle  Take daily care of your teeth and gums.  Stay active. Exercise for at least 30 minutes on 5 or more days each week.  Do not use any products that contain nicotine or tobacco, such as cigarettes, e-cigarettes, and chewing tobacco. If you need help quitting, ask your health care provider.  If you are sexually active, practice safe sex. Use a condom or other form of birth control (contraception) in order to prevent pregnancy and STIs (sexually transmitted infections).  If told by your health care provider, take low-dose aspirin daily starting at age 56. What's next?  Visit your health care provider once a year for a well check visit.  Ask your health care provider how often you should have your eyes and teeth checked.  Stay up to date on all vaccines. This information is not intended to replace advice given to you by your health care provider. Make  sure you discuss any questions you have with your health care provider. Document Revised: 12/04/2017 Document Reviewed: 12/04/2017 Elsevier Patient Education  [place] tunnel syndrome is a condition that causes pain in your hand and arm. The carpal tunnel is a narrow area that is on the palm side of your wrist. Repeated wrist motion or certain diseases may cause swelling in the  tunnel. This swelling can pinch the main nerve in the wrist (median nerve). What are the causes? This condition may be caused by:  Repeated wrist motions.  Wrist injuries.  Arthritis.  A sac of fluid (cyst) or abnormal growth (tumor) in the carpal tunnel.  Fluid buildup during pregnancy. Sometimes the cause is not known. What increases the risk? The following factors may make you more likely to develop this condition:  Having a job in which you move your wrist in the same way many times. This includes jobs like being a Software engineer or a Scientist, water quality.  Being a woman.  Having other health conditions, such as: ? Diabetes. ? Obesity. ? A thyroid gland that is not active enough (hypothyroidism). ? Kidney failure. What are the signs or symptoms? Symptoms of this condition include:  A tingling feeling in your fingers.  Tingling or a loss of feeling (numbness) in your hand.  Pain in your entire arm. This pain may get worse when you bend your wrist and elbow for a long time.  Pain in your wrist that goes up your arm to your shoulder.  Pain that goes down into your palm or fingers.  A weak feeling in your hands. You may find it hard to grab and hold items. You may feel worse at night. How is this diagnosed? This condition is diagnosed with a medical history and physical exam. You may also have tests, such as:  Electromyogram (EMG). This test checks the signals that the nerves send to the muscles.  Nerve conduction study. This test checks how well signals pass through your nerves.  Imaging tests, such as X-rays, ultrasound, and MRI. These tests check for what might be the cause of your condition. How is this treated? This condition may be treated with:  Lifestyle changes. You will be asked to stop or change the activity that caused your problem.  Doing exercise and activities that make bones and muscles stronger (physical therapy).  Learning how to use your hand again (occupational  therapy).  Medicines for pain and swelling (inflammation). You may have injections in your wrist.  A wrist splint.  Surgery. Follow these instructions at home: If you have a splint:  Wear the splint as told by your doctor. Remove it only as told by your doctor.  Loosen the splint if your fingers: ? Tingle. ? Lose feeling (become numb). ? Turn cold and blue.  Keep the splint clean.  If the splint is not waterproof: ? Do not let it get wet. ? Cover it with a watertight covering when you take a bath or a shower. Managing pain, stiffness, and swelling   If told, put ice on the painful area: ? If you have a removable splint, remove it as told by your doctor. ? Put ice in a plastic bag. ? Place a towel between your skin and the bag. ? Leave the ice on for 20 minutes, 2-3 times per day. General instructions  Take over-the-counter and prescription medicines only as told by your doctor.  Rest your wrist from any  activity that may cause pain. If needed, talk with your boss at work about changes that can help your wrist heal.  Do any exercises as told by your doctor, physical therapist, or occupational therapist.  Keep all follow-up visits as told by your doctor. This is important. Contact a doctor if:  You have new symptoms.  Medicine does not help your pain.  Your symptoms get worse. Get help right away if:  You have very bad numbness or tingling in your wrist or hand. Summary  Carpal tunnel syndrome is a condition that causes pain in your hand and arm.  It is often caused by repeated wrist motions.  Lifestyle changes and medicines are used to treat this problem. Surgery may help in very bad cases.  Follow your doctor's instructions about wearing a splint, resting your wrist, keeping follow-up visits, and calling for help. This information is not intended to replace advice given to you by your health care provider. Make sure you discuss any questions you have with  your health care provider. Document Revised: 08/01/2017 Document Reviewed: 08/01/2017 Elsevier Patient Education  [place]

## 2020-03-09 NOTE — Progress Notes (Signed)
[id number]

## 2020-03-10 LAB — CBC WITH DIFFERENTIAL/PLATELET
Absolute Monocytes: 380 cells/uL (ref 200–950)
Basophils Absolute: 73 cells/uL (ref 0–200)
Basophils Relative: 1 %
Eosinophils Absolute: 197 cells/uL (ref 15–500)
Eosinophils Relative: 2.7 %
HCT: 41 % (ref 35.0–45.0)
Hemoglobin: 13.8 g/dL (ref 11.7–15.5)
Lymphs Abs: 2446 cells/uL (ref 850–3900)
MCH: 27.5 pg (ref 27.0–33.0)
MCHC: 33.7 g/dL (ref 32.0–36.0)
MCV: 81.7 fL (ref 80.0–100.0)
MPV: 10.1 fL (ref 7.5–12.5)
Monocytes Relative: 5.2 %
Neutro Abs: 4205 cells/uL (ref 1500–7800)
Neutrophils Relative %: 57.6 %
Platelets: 304 10*3/uL (ref 140–400)
RBC: 5.02 10*6/uL (ref 3.80–5.10)
RDW: 12.7 % (ref 11.0–15.0)
Total Lymphocyte: 33.5 %
WBC: 7.3 10*3/uL (ref 3.8–10.8)

## 2020-03-10 LAB — COMPLETE METABOLIC PANEL WITH GFR
AG Ratio: 1.5 (calc) (ref 1.0–2.5)
ALT: 17 U/L (ref 6–29)
AST: 19 U/L (ref 10–35)
Albumin: 4.4 g/dL (ref 3.6–5.1)
Alkaline phosphatase (APISO): 61 U/L (ref 31–125)
BUN: 13 mg/dL (ref 7–25)
CO2: 24 mmol/L (ref 20–32)
Calcium: 9.7 mg/dL (ref 8.6–10.2)
Chloride: 103 mmol/L (ref 98–110)
Creat: 0.67 mg/dL (ref 0.50–1.10)
GFR, Est African American: 123 mL/min/{1.73_m2} (ref >=60)
GFR, Est Non African American: 106 mL/min/{1.73_m2} (ref >=60)
Globulin: 3 g/dL (calc) (ref 1.9–3.7)
Glucose, Bld: 90 mg/dL (ref 65–99)
Potassium: 4.8 mmol/L (ref 3.5–5.3)
Sodium: 140 mmol/L (ref 135–146)
Total Bilirubin: 0.7 mg/dL (ref 0.2–1.2)
Total Protein: 7.4 g/dL (ref 6.1–8.1)

## 2020-03-10 LAB — VITAMIN D 25 HYDROXY (VIT D DEFICIENCY, FRACTURES): Vit D, 25-Hydroxy: 35 ng/mL (ref 30–100)

## 2020-03-10 LAB — LIPID PANEL
Cholesterol: 271 mg/dL — ABNORMAL HIGH (ref <200)
HDL: 50 mg/dL (ref >=50)
LDL Cholesterol (Calc): 186 mg/dL (calc) — ABNORMAL HIGH
Non-HDL Cholesterol (Calc): 221 mg/dL (calc) — ABNORMAL HIGH (ref <130)
Total CHOL/HDL Ratio: 5.4 (calc) — ABNORMAL HIGH (ref <5.0)
Triglycerides: 181 mg/dL — ABNORMAL HIGH (ref <150)

## 2020-03-16 ENCOUNTER — Encounter: Payer: Self-pay | Admitting: *Deleted

## 2020-03-21 ENCOUNTER — Other Ambulatory Visit: Payer: Self-pay

## 2020-03-21 ENCOUNTER — Ambulatory Visit
Admission: RE | Admit: 2020-03-21 | Discharge: 2020-03-21 | Disposition: A | Discharge status: Home / Self Care | Payer: BC Managed Care – PPO | Source: Ambulatory Visit | Attending: Family Medicine | Admitting: Family Medicine

## 2020-03-21 ENCOUNTER — Telehealth: Payer: BC Managed Care – PPO

## 2020-03-21 DIAGNOSIS — Z1231 Encounter for screening mammogram for malignant neoplasm of breast: Secondary | ICD-10-CM | POA: Diagnosis not present

## 2020-03-21 DIAGNOSIS — Z0000 Encounter for general adult medical examination without abnormal findings: Secondary | ICD-10-CM | POA: Diagnosis not present

## 2020-03-23 ENCOUNTER — Ambulatory Visit (INDEPENDENT_AMBULATORY_CARE_PROVIDER_SITE_OTHER): Payer: BC Managed Care – PPO | Admitting: Obstetrics and Gynecology

## 2020-03-23 ENCOUNTER — Other Ambulatory Visit: Payer: Self-pay

## 2020-03-23 ENCOUNTER — Encounter: Payer: Self-pay | Admitting: Obstetrics and Gynecology

## 2020-03-23 VITALS — BP 120/60 | Ht 63.0 in | Wt 164.0 lb

## 2020-03-23 DIAGNOSIS — Z1231 Encounter for screening mammogram for malignant neoplasm of breast: Secondary | ICD-10-CM | POA: Diagnosis not present

## 2020-03-23 DIAGNOSIS — Z1211 Encounter for screening for malignant neoplasm of colon: Secondary | ICD-10-CM

## 2020-03-23 DIAGNOSIS — R1031 Right lower quadrant pain: Secondary | ICD-10-CM | POA: Diagnosis not present

## 2020-03-23 DIAGNOSIS — Z01419 Encounter for gynecological examination (general) (routine) without abnormal findings: Secondary | ICD-10-CM

## 2020-03-23 NOTE — Progress Notes (Signed)
PCP:  Hubbard Hartshorn, FNP   Chief Complaint  Patient presents with   Gynecologic Exam   Pelvic Pain    Right side only x 3 days, denies uti sx     HPI:      [name] is a 44 y.o. No obstetric history on file. who LMP was Patient's last menstrual period was 02/28/2020 (exact date)., presents today for her annual examination.  Her menses are regular every 28-30 days, lasting 5 days, med flow.  Dysmenorrhea mild, occurring first 1-2 days of flow. She does not have intermenstrual bleeding. She had issues with menorrhagia 2 yrs ago but menses are better now.   Pt with RLQ pain again for 3 days, sx gone today. Pain is achy, sharp, stabbing; radiates to low back and down front of RT leg. Also feels bloated and with a little constipation with pain. Improved with ibup 600 mg BID but concerning to pt. No GI, urin sx with pain. No dyspareunia. Hx of 5 large leio. Had same sx 3/21 with neg GYN u/s except leio. No sx since.   Sex activity: single partner, contraception - condoms. Declines other BC. No dyspareunia. Last Pap: December 21, 2015  Results were: no abnormalities /neg HPV DNA  Hx of STDs: none  Last mammogram: 03/21/20 Results pending. There is no FH of breast cancer. There is no FH of ovarian cancer. The patient does do self-breast exams.  Tobacco use: The patient denies current or previous tobacco use. Alcohol use: none No drug use.  Exercise: moderately active  Colonoscopy: never; insurance not covering till age 59  She does get adequate calcium and Vitamin D in her diet. Hx of Vit D deficiency, normal labs with PCP. Still taking supp.   Labs with PCP.  Past Medical History:  Diagnosis Date   B12 deficiency    Menorrhagia    Vitamin D deficiency     History reviewed. No pertinent surgical history.  Family History  Problem Relation Age of Onset   Diabetes Mother    Rheum arthritis Mother    Hypertension Mother    Hyperlipidemia Mother     Diabetes Father    Hyperlipidemia Sister    Hyperlipidemia Sister    Breast cancer Neg Hx     Social History   Socioeconomic History   Marital status: Married    Spouse name: Advertising account planner   Number of children: 2   Years of education: Not on file   Highest education level: Not on file  Occupational History   Not on file  Tobacco Use   Smoking status: Never Smoker   Smokeless tobacco: Never Used  Vaping Use   Vaping Use: Never used  Substance and Sexual Activity   Alcohol use: Yes    Comment: occasional   Drug use: No   Sexual activity: Yes    Partners: Male    Birth control/protection: None  Other Topics Concern   Not on file  Social History Narrative   Married - Husband's name is [place]; Son is Chase (48yo), Daughter Luvenia Starch (41) in college.    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   Social Determinants of Health   Financial Resource Strain: Low Risk    Difficulty of Paying Living Expenses: Not hard at all  Food Insecurity: No Food Insecurity   Worried About Charity fundraiser in the Last Year: Never true   Ran Out of Food in the Last Year: Never true  Transportation Needs: No Data processing manager (Medical): No   Lack of Transportation (Non-Medical): No  Physical Activity: Insufficiently Active   Days of Exercise per Week: 2 days   Minutes of Exercise per Session: 30 min  Stress: No Stress Concern Present   Feeling of Stress : Not at all  Social Connections: Moderately Isolated   Frequency of Communication with Friends and Family: More than three times a week   Frequency of Social Gatherings with Friends and Family: More than three times a week   Attends Religious Services: Never   Marine scientist or Organizations: No   Attends Music therapist: Never   Marital Status: Married  Human resources officer Violence: Not At Risk   Fear of Current or Ex-Partner: No   Emotionally Abused: No    Physically Abused: No   Sexually Abused: No    Current Meds  Medication Sig   cholecalciferol (VITAMIN D3) 25 MCG (1000 UT) tablet Take 1,000 Units by mouth daily.   loratadine (CLARITIN) 10 MG tablet Take 10 mg by mouth daily.     ROS:  Review of Systems  Constitutional: Negative for fatigue, fever and unexpected weight change.  Respiratory: Negative for cough, shortness of breath and wheezing.   Cardiovascular: Negative for chest pain, palpitations and leg swelling.  Gastrointestinal: Negative for blood in stool, constipation, diarrhea, nausea and vomiting.  Endocrine: Negative for cold intolerance, heat intolerance and polyuria.  Genitourinary: Positive for pelvic pain. Negative for dyspareunia, dysuria, flank pain, frequency, genital sores, hematuria, menstrual problem, urgency, vaginal bleeding, vaginal discharge and vaginal pain.  Musculoskeletal: Negative for back pain, joint swelling and myalgias.  Skin: Negative for rash.  Neurological: Negative for dizziness, syncope, light-headedness, numbness and headaches.  Hematological: Negative for adenopathy.  Psychiatric/Behavioral: Negative for agitation, confusion, sleep disturbance and suicidal ideas. The patient is not nervous/anxious.      Objective: BP 120/60    Ht 5\' 3"  (1.6 m)    Wt 164 lb (74.4 kg)    LMP 02/28/2020 (Exact Date)    BMI 29.05 kg/m    Physical Exam Constitutional:      General: She is not in acute distress.    Appearance: She is well-developed.  Genitourinary:     Vulva normal.     Right Labia: No rash, tenderness or lesions.    Left Labia: No tenderness, lesions or rash.    No vaginal discharge, erythema, tenderness or bleeding.      Right Adnexa: tender.    Right Adnexa: no mass present.    Left Adnexa: not tender and no mass present.    No cervical motion tenderness, friability or polyp.     Uterus is enlarged.     Uterus is not tender.  Breasts:     Right: No mass, nipple discharge,  skin change or tenderness.     Left: No mass, nipple discharge, skin change or tenderness.    Neck:     Thyroid: No thyromegaly.  Cardiovascular:     Rate and Rhythm: Normal rate and regular rhythm.     Heart sounds: Normal heart sounds. No murmur heard.   Pulmonary:     Effort: Pulmonary effort is normal.     Breath sounds: Normal breath sounds.  Abdominal:     Palpations: Abdomen is soft.     Tenderness: There is no abdominal tenderness. There is no guarding or rebound.  Musculoskeletal:        General: Normal  range of motion.     Cervical back: Normal range of motion.  Lymphadenopathy:     Cervical: No cervical adenopathy.  Neurological:     General: No focal deficit present.     Mental Status: She is alert and oriented to person, place, and time.     Cranial Nerves: No cranial nerve deficit.  Skin:    General: Skin is warm and dry.  Psychiatric:        Mood and Affect: Mood normal.        Behavior: Behavior normal.        Thought Content: Thought content normal.        Judgment: Judgment normal.  Vitals and nursing note reviewed.     Assessment/Plan: Encounter for annual routine gynecological examination  Encounter for screening mammogram for malignant neoplasm of breast; pt had mammo 2 days ago, awaiting results.   RLQ abdominal pain--for 3 days, now resolved. Similar sx 3/21 with neg GYN u/s except leio. Question ovar cyst since sx infrequent and short lived. Can do ibup 600 mg QID if sx recur and will check GYN u/s while pt having pain. Doubt it's related to leio.  Screening for colon cancer--colonoscopy/cologuard discussed. pt to check with ins re: cologuard coverage since colonoscopy not covered. Will f/u if desires ref.    GYN counsel mammography screening, adequate intake of calcium and vitamin D, diet and exercise     F/U  Return in about 1 year (around 03/23/2021).  Andon Villard B. Manmeet Arzola, PA-C 03/23/2020 11:47 AM

## 2020-03-23 NOTE — Patient Instructions (Signed)
I value your feedback and entrusting us with your care. If you get a [facility] patient survey, I would appreciate you taking the time to let us know about your experience today. Thank you!  As of March 18, 2019, your lab results will be released to your MyChart immediately, before I even have a chance to see them. Please give me time to review them and contact you if there are any abnormalities. Thank you for your patience.  

## 2020-04-05 ENCOUNTER — Encounter: Payer: Self-pay | Admitting: Family Medicine

## 2020-05-16 DIAGNOSIS — H10213 Acute toxic conjunctivitis, bilateral: Secondary | ICD-10-CM | POA: Diagnosis not present

## 2021-04-10 ENCOUNTER — Other Ambulatory Visit: Payer: Self-pay

## 2021-04-10 ENCOUNTER — Ambulatory Visit (INDEPENDENT_AMBULATORY_CARE_PROVIDER_SITE_OTHER): Payer: BC Managed Care – PPO | Admitting: Obstetrics and Gynecology

## 2021-04-10 ENCOUNTER — Encounter: Payer: Self-pay | Admitting: Obstetrics and Gynecology

## 2021-04-10 ENCOUNTER — Other Ambulatory Visit (HOSPITAL_COMMUNITY)
Admission: RE | Admit: 2021-04-10 | Discharge: 2021-04-10 | Disposition: A | Discharge status: Home / Self Care | Payer: BC Managed Care – PPO | Source: Ambulatory Visit | Attending: Obstetrics and Gynecology | Admitting: Obstetrics and Gynecology

## 2021-04-10 VITALS — BP 106/80 | Ht 63.0 in | Wt 166.0 lb

## 2021-04-10 DIAGNOSIS — Z1211 Encounter for screening for malignant neoplasm of colon: Secondary | ICD-10-CM

## 2021-04-10 DIAGNOSIS — Z1151 Encounter for screening for human papillomavirus (HPV): Secondary | ICD-10-CM | POA: Insufficient documentation

## 2021-04-10 DIAGNOSIS — Z124 Encounter for screening for malignant neoplasm of cervix: Secondary | ICD-10-CM | POA: Insufficient documentation

## 2021-04-10 DIAGNOSIS — D219 Benign neoplasm of connective and other soft tissue, unspecified: Secondary | ICD-10-CM

## 2021-04-10 DIAGNOSIS — L02818 Cutaneous abscess of other sites: Secondary | ICD-10-CM

## 2021-04-10 DIAGNOSIS — Z1231 Encounter for screening mammogram for malignant neoplasm of breast: Secondary | ICD-10-CM

## 2021-04-10 DIAGNOSIS — Z01419 Encounter for gynecological examination (general) (routine) without abnormal findings: Secondary | ICD-10-CM

## 2021-04-10 NOTE — Progress Notes (Signed)
PCP:  Delsa Grana, PA-C   Chief Complaint  Patient presents with   Gynecologic Exam    Cyst/ingrown hair that is noticed during cycles     HPI:      [name] is a 46 y.o. No obstetric history on file. who LMP was Patient's last menstrual period was 03/28/2021 (exact date)., presents today for her annual examination.  Her menses are regular every 28-30 days, lasting 4-5 days, med flow.  Dysmenorrhea mild, occurring first 1-2 days of flow. She does not have intermenstrual bleeding. She had issues with menorrhagia in past but menses are better now.   Pt with hx of RLQ pain that is achy, sharp, stabbing; radiates to low back and down front of RT leg. Sx only 2-3 times this yr, mostly with ovulation, improved with ibup 600 mg. Hx of 5 large leio. Has had neg GYN u/s in past except leio.  Sex activity: single partner, contraception - condoms. Declines other BC. No dyspareunia. Last Pap: December 21, 2015  Results were: no abnormalities /neg HPV DNA  Hx of STDs: none  Has had issues with abscess/ingrown hair RT buttocks area past few months. Sx flare with menses and drain, then recur with next menses. Has left scar. No similar sx elsewhere.   Last mammogram: 03/21/20 Results were normal, repeat in 12 months There is no FH of breast cancer. There is no FH of ovarian cancer. The patient does do self-breast exams.  Tobacco use: The patient denies current or previous tobacco use. Alcohol use: none No drug use.  Exercise: moderately active  Colonoscopy: never; insurance not covering till age 63. Has had issues with rectal bleeding, eval with provider. Thought to be due to internal hemorrhoids. Also with diarrhea/bloating. Sx improved with addition of probiotics this past month.   She does get adequate calcium and Vitamin D in her diet. Hx of Vit D deficiency  Labs with PCP.  Past Medical History:  Diagnosis Date   B12 deficiency    Menorrhagia    Vitamin D deficiency      Past Surgical History:  Procedure Laterality Date   NO PAST SURGERIES      Family History  Problem Relation Age of Onset   Diabetes Mother    Rheum arthritis Mother    Hypertension Mother    Hyperlipidemia Mother    Diabetes Father    Hyperlipidemia Sister    Hyperlipidemia Sister    Breast cancer Neg Hx     Social History   Socioeconomic History   Marital status: Married    Spouse name: Advertising account planner   Number of children: 2   Years of education: Not on file   Highest education level: Not on file  Occupational History   Not on file  Tobacco Use   Smoking status: Never   Smokeless tobacco: Never  Vaping Use   Vaping Use: Never used  Substance and Sexual Activity   Alcohol use: Yes    Comment: occasional   Drug use: No   Sexual activity: Yes    Partners: Male    Birth control/protection: None  Other Topics Concern   Not on file  Social History Narrative   Married - Husband's name is [place]; Son is Chase (42yo), Daughter Luvenia Starch (31) in college.    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   Social Determinants of Health   Financial Resource Strain: Not on file  Food Insecurity: Not on file  Transportation Needs:  Not on file  Physical Activity: Not on file  Stress: Not on file  Social Connections: Not on file  Intimate Partner Violence: Not on file    Current Meds  Medication Sig   cholecalciferol (VITAMIN D3) 25 MCG (1000 UT) tablet Take 1,000 Units by mouth daily.   loratadine (CLARITIN) 10 MG tablet Take 10 mg by mouth daily.     ROS:  Review of Systems  Constitutional:  Negative for fatigue, fever and unexpected weight change.  Respiratory:  Negative for cough, shortness of breath and wheezing.   Cardiovascular:  Negative for chest pain, palpitations and leg swelling.  Gastrointestinal:  Negative for blood in stool, constipation, diarrhea, nausea and vomiting.  Endocrine: Negative for cold intolerance, heat intolerance and polyuria.   Genitourinary:  Negative for dyspareunia, dysuria, flank pain, frequency, genital sores, hematuria, menstrual problem, pelvic pain, urgency, vaginal bleeding, vaginal discharge and vaginal pain.  Musculoskeletal:  Negative for back pain, joint swelling and myalgias.  Skin:  Positive for wound. Negative for rash.  Neurological:  Negative for dizziness, syncope, light-headedness, numbness and headaches.  Hematological:  Negative for adenopathy.  Psychiatric/Behavioral:  Negative for agitation, confusion, sleep disturbance and suicidal ideas. The patient is not nervous/anxious.     Objective: BP 106/80    Ht 5\' 3"  (1.6 m)    Wt 166 lb (75.3 kg)    LMP 03/28/2021 (Exact Date)    BMI 29.41 kg/m    Physical Exam Constitutional:      General: She is not in acute distress.    Appearance: She is well-developed.  Genitourinary:     Vulva normal.     Right Labia: No rash, tenderness or lesions.    Left Labia: No tenderness, lesions or rash.       No vaginal discharge, erythema, tenderness or bleeding.      Right Adnexa: not tender and no mass present.    Left Adnexa: not tender and no mass present.    No cervical motion tenderness, friability or polyp.     Uterus is enlarged.     Uterus is not tender.  Breasts:    Right: No mass, nipple discharge, skin change or tenderness.     Left: No mass, nipple discharge, skin change or tenderness.  Neck:     Thyroid: No thyromegaly.  Cardiovascular:     Rate and Rhythm: Normal rate and regular rhythm.     Heart sounds: Normal heart sounds. No murmur heard. Pulmonary:     Effort: Pulmonary effort is normal.     Breath sounds: Normal breath sounds.  Abdominal:     Palpations: Abdomen is soft.     Tenderness: There is no abdominal tenderness. There is no guarding or rebound.  Musculoskeletal:        General: Normal range of motion.     Cervical back: Normal range of motion.  Lymphadenopathy:     Cervical: No cervical adenopathy.   Neurological:     General: No focal deficit present.     Mental Status: She is alert and oriented to person, place, and time.     Cranial Nerves: No cranial nerve deficit.  Skin:    General: Skin is warm and dry.  Psychiatric:        Mood and Affect: Mood normal.        Behavior: Behavior normal.        Thought Content: Thought content normal.        Judgment: Judgment normal.  Vitals  and nursing note reviewed.    Assessment/Plan: Encounter for annual routine gynecological examination  Cervical cancer screening - Plan: Cytology - PAP  Screening for HPV (human papillomavirus) - Plan: Cytology - PAP  Encounter for screening mammogram for malignant neoplasm of breast - Plan: MM 3D SCREEN BREAST BILATERAL; pt to sheds mammo  Screening for colon cancer--discussed colonoscopy due to hx of rectal bleeding this yr. Pt to discuss with insurance co and f/u with me or PCP for GI ref.   Leiomyoma--stable, not affecting menses.  Cutaneous abscess of other site--RT buttocks/vulvar area. Resolved currently, so hopefully won't reform next menses. If so, warm compresses and pt to call for abx Rx.  F/u prn.    GYN counsel mammography screening, adequate intake of calcium and vitamin D, diet and exercise     F/U  Return in about 1 year (around 04/10/2022).  Netanya Yazdani B. Newman Waren, PA-C 04/10/2021 2:14 PM

## 2021-04-10 NOTE — Patient Instructions (Signed)
I value your feedback and you entrusting us with your care. If you get a [facility] patient survey, I would appreciate you taking the time to let us know about your experience today. Thank you!  [facility]: [phone]      

## 2021-04-13 LAB — CYTOLOGY - PAP
Comment: NEGATIVE
Diagnosis: NEGATIVE
High risk HPV: NEGATIVE

## 2021-04-16 ENCOUNTER — Other Ambulatory Visit: Payer: Self-pay

## 2021-04-16 ENCOUNTER — Ambulatory Visit: Payer: BC Managed Care – PPO | Admitting: Nurse Practitioner

## 2021-04-16 ENCOUNTER — Encounter: Payer: Self-pay | Admitting: Nurse Practitioner

## 2021-04-16 VITALS — BP 120/70 | HR 89 | Temp 98.5°F | Resp 16 | Ht 63.0 in | Wt 164.7 lb

## 2021-04-16 DIAGNOSIS — M79642 Pain in left hand: Secondary | ICD-10-CM | POA: Diagnosis not present

## 2021-04-16 DIAGNOSIS — K625 Hemorrhage of anus and rectum: Secondary | ICD-10-CM | POA: Diagnosis not present

## 2021-04-16 DIAGNOSIS — Z131 Encounter for screening for diabetes mellitus: Secondary | ICD-10-CM | POA: Diagnosis not present

## 2021-04-16 DIAGNOSIS — E559 Vitamin D deficiency, unspecified: Secondary | ICD-10-CM

## 2021-04-16 DIAGNOSIS — M25532 Pain in left wrist: Secondary | ICD-10-CM | POA: Diagnosis not present

## 2021-04-16 DIAGNOSIS — E782 Mixed hyperlipidemia: Secondary | ICD-10-CM | POA: Diagnosis not present

## 2021-04-16 DIAGNOSIS — L2082 Flexural eczema: Secondary | ICD-10-CM

## 2021-04-16 DIAGNOSIS — Z1211 Encounter for screening for malignant neoplasm of colon: Secondary | ICD-10-CM

## 2021-04-16 DIAGNOSIS — Z0000 Encounter for general adult medical examination without abnormal findings: Secondary | ICD-10-CM | POA: Diagnosis not present

## 2021-04-16 DIAGNOSIS — Z130 Encounter for screening for diseases of the blood and blood-forming organs and certain disorders involving the immune mechanism: Secondary | ICD-10-CM

## 2021-04-16 MED ORDER — MOMETASONE FUROATE 0.1 % EX SOLN: Freq: Every day | CUTANEOUS | 0 refills | Status: DC

## 2021-04-16 NOTE — Progress Notes (Signed)
BP 120/70    Pulse 89    Temp 98.5 F (36.9 C) (Oral)    Resp 16    Ht 5\' 3"  (1.6 m)    Wt 164 lb 11.2 oz (74.7 kg)    LMP 03/28/2021 (Exact Date)    SpO2 97%    BMI 29.18 kg/m    Subjective:    Patient ID: Sarah Carrillo, female    DOB: [birth year], 46 y.o.   MRN: [id number]  HPI: Sarah Carrillo is a 46 y.o. female, here alone  Chief Complaint  Patient presents with   Rectal Bleeding    Referral for  Colonoscopy   Rectal bleeding: She says in December she had some bright red rectal bleeding. She denies any pain.  She says she started taking a probiotic and symptoms have improved. She says she is not currently having any bleeding. She says says that this has happened in the past and she had a hemorrhoid.  She says she would like to get a colonoscopy to check everything out.   Left hand/wrist pain:  She says she started having left hand/ wrist pain. She says she it hurts more in the morning after sleeping. She thinks she is bending it when she sleeps.  She says she just got a wrist splint at the store and is trying that at night to see if it makes a difference.  Discussed taking a NSAID like ibuprofen for pain. No decrease ROM.   Eczema: She says she has had eczema for a long time.  She says she has a flare right now in the bend of her elbow.  Discussed keeping it moisturized.  Hyperlipidemia:  She says she is not currently taking any medication but she knows her cholesterol was high last time.  She says that she is agreeable to start medication if it is still high. Discussed side effects. Her last LDL was 186 on 03/09/20. Will get labs today.  The 10-year ASCVD risk score (Arnett DK, et al., 2019) is: 1.5%   Values used to calculate the score:     Age: 85 years     Sex: Female     Is Non-Hispanic African American: No     Diabetic: No     Tobacco smoker: No     Systolic Blood Pressure: 470 mmHg     Is BP treated: No     HDL Cholesterol: 50 mg/dL     Total Cholesterol: 271 mg/dL    Relevant past medical, surgical, family and social history reviewed and updated as indicated. Interim medical history since our last visit reviewed. Allergies and medications reviewed and updated.  Review of Systems  Constitutional: Negative for fever or weight change.  Respiratory: Negative for cough and shortness of breath.   Cardiovascular: Negative for chest pain or palpitations.  Gastrointestinal: Negative for abdominal pain, no bowel changes.  Musculoskeletal: Negative for gait problem or joint swelling. Left hand/wrist pain Skin: positive for atopic dermatitis on bilateral ACs. Neurological: Negative for dizziness or headache.  No other specific complaints in a complete review of systems (except as listed in HPI above).      Objective:    BP 120/70    Pulse 89    Temp 98.5 F (36.9 C) (Oral)    Resp 16    Ht 5\' 3"  (1.6 m)    Wt 164 lb 11.2 oz (74.7 kg)    LMP 03/28/2021 (Exact Date)    SpO2 97%  BMI 29.18 kg/m   Wt Readings from Last 3 Encounters:  04/16/21 164 lb 11.2 oz (74.7 kg)  04/10/21 166 lb (75.3 kg)  03/23/20 164 lb (74.4 kg)    Physical Exam  Constitutional: Patient appears well-developed and well-nourished. No distress.  HEENT: head atraumatic, normocephalic, pupils equal and reactive to light, neck supple Cardiovascular: Normal rate, regular rhythm and normal heart sounds.  No murmur heard. No BLE edema. Pulmonary/Chest: Effort normal and breath sounds normal. No respiratory distress. Abdominal: Soft.  There is no tenderness. Musculoskeletal:  no decrease ROM, no tenderness Psychiatric: Patient has a normal mood and affect. behavior is normal. Judgment and thought content normal.   Results for orders placed or performed in visit on 04/10/21  Cytology - PAP  Result Value Ref Range   High risk HPV Negative    Adequacy      Satisfactory for evaluation; transformation zone component PRESENT.   Diagnosis      - Negative for intraepithelial lesion or  malignancy (NILM)   Comment Normal Reference Range HPV - Negative       Assessment & Plan:   1. Rectal bleeding  - Ambulatory referral to Gastroenterology - CBC with Differential/Platelet  2. Left hand pain -wear wrist splint -take ibuprofen prn  3. Left wrist pain -wear wrist splint -take ibuprofen prn  4. Flexural eczema -use a hydrating lotion - mometasone (ELOCON) 0.1 % lotion; Apply topically daily.  Dispense: 60 mL; Refill: 0  5. Mixed hyperlipidemia  - Lipid panel - COMPLETE METABOLIC PANEL WITH GFR  6. Vitamin D deficiency  - VITAMIN D 25 Hydroxy (Vit-D Deficiency, Fractures)  7. Screening for malignant neoplasm of colon  - Ambulatory referral to Gastroenterology  8. Screening for diabetes mellitus  - COMPLETE METABOLIC PANEL WITH GFR - Hemoglobin A1c  9. Screening for deficiency anemia  - CBC with Differential/Platelet   Follow up plan: Return in about 6 months (around 10/14/2021) for follow up.

## 2021-04-17 ENCOUNTER — Other Ambulatory Visit: Payer: Self-pay | Admitting: Nurse Practitioner

## 2021-04-17 LAB — COMPLETE METABOLIC PANEL WITH GFR
AG Ratio: 1.6 (calc) (ref 1.0–2.5)
ALT: 9 U/L (ref 6–29)
AST: 14 U/L (ref 10–35)
Albumin: 4.4 g/dL (ref 3.6–5.1)
Alkaline phosphatase (APISO): 63 U/L (ref 31–125)
BUN: 14 mg/dL (ref 7–25)
CO2: 25 mmol/L (ref 20–32)
Calcium: 9.4 mg/dL (ref 8.6–10.2)
Chloride: 102 mmol/L (ref 98–110)
Creat: 0.62 mg/dL (ref 0.50–0.99)
Globulin: 2.7 g/dL (calc) (ref 1.9–3.7)
Glucose, Bld: 94 mg/dL (ref 65–99)
Potassium: 4.5 mmol/L (ref 3.5–5.3)
Sodium: 137 mmol/L (ref 135–146)
Total Bilirubin: 0.7 mg/dL (ref 0.2–1.2)
Total Protein: 7.1 g/dL (ref 6.1–8.1)
eGFR: 111 mL/min/{1.73_m2} (ref >=60)

## 2021-04-17 LAB — CBC WITH DIFFERENTIAL/PLATELET
Absolute Monocytes: 352 cells/uL (ref 200–950)
Basophils Absolute: 51 cells/uL (ref 0–200)
Basophils Relative: 0.8 %
Eosinophils Absolute: 179 cells/uL (ref 15–500)
Eosinophils Relative: 2.8 %
HCT: 40.8 % (ref 35.0–45.0)
Hemoglobin: 13.1 g/dL (ref 11.7–15.5)
Lymphs Abs: 1939 cells/uL (ref 850–3900)
MCH: 26.4 pg — ABNORMAL LOW (ref 27.0–33.0)
MCHC: 32.1 g/dL (ref 32.0–36.0)
MCV: 82.3 fL (ref 80.0–100.0)
MPV: 10.4 fL (ref 7.5–12.5)
Monocytes Relative: 5.5 %
Neutro Abs: 3878 cells/uL (ref 1500–7800)
Neutrophils Relative %: 60.6 %
Platelets: 292 10*3/uL (ref 140–400)
RBC: 4.96 10*6/uL (ref 3.80–5.10)
RDW: 13.2 % (ref 11.0–15.0)
Total Lymphocyte: 30.3 %
WBC: 6.4 10*3/uL (ref 3.8–10.8)

## 2021-04-17 LAB — LIPID PANEL
Cholesterol: 239 mg/dL — ABNORMAL HIGH (ref <200)
HDL: 49 mg/dL — ABNORMAL LOW (ref >=50)
LDL Cholesterol (Calc): 160 mg/dL (calc) — ABNORMAL HIGH
Non-HDL Cholesterol (Calc): 190 mg/dL (calc) — ABNORMAL HIGH (ref <130)
Total CHOL/HDL Ratio: 4.9 (calc) (ref <5.0)
Triglycerides: 151 mg/dL — ABNORMAL HIGH (ref <150)

## 2021-04-17 LAB — HEMOGLOBIN A1C
Hgb A1c MFr Bld: 5.5 % of total Hgb (ref <5.7)
Mean Plasma Glucose: 111 mg/dL
eAG (mmol/L): 6.2 mmol/L

## 2021-04-17 LAB — VITAMIN D 25 HYDROXY (VIT D DEFICIENCY, FRACTURES): Vit D, 25-Hydroxy: 20 ng/mL — ABNORMAL LOW (ref 30–100)

## 2021-04-18 ENCOUNTER — Other Ambulatory Visit: Payer: Self-pay | Admitting: Nurse Practitioner

## 2021-04-18 ENCOUNTER — Telehealth: Payer: Self-pay | Admitting: Family Medicine

## 2021-04-18 DIAGNOSIS — L2082 Flexural eczema: Secondary | ICD-10-CM

## 2021-04-18 MED ORDER — TRIAMCINOLONE 0.1 % CREAM:EUCERIN CREAM 1:1: 1.0000 "application " | TOPICAL_CREAM | Freq: Three times a day (TID) | CUTANEOUS | 1 refills | Status: DC

## 2021-04-18 NOTE — Telephone Encounter (Signed)
Copied from [place] [id number]. Topic: General - Other >> Apr 18, 2021 11:14 AM Leward Quan A wrote: Reason for CRM: Patient called in to inform Ms Reece Packer that Rx sent for mometasone (ELOCON) 0.1 % lotion is very liquidly and irritates the skin so patient would like an Rx sent for Triamcinolone Cream sent in please call with questions  Ph# [phone]

## 2021-05-22 ENCOUNTER — Ambulatory Visit
Admission: RE | Admit: 2021-05-22 | Discharge: 2021-05-22 | Disposition: A | Discharge status: Home / Self Care | Payer: BC Managed Care – PPO | Source: Ambulatory Visit | Attending: Obstetrics and Gynecology | Admitting: Obstetrics and Gynecology

## 2021-05-22 ENCOUNTER — Other Ambulatory Visit: Payer: Self-pay

## 2021-05-22 DIAGNOSIS — Z1231 Encounter for screening mammogram for malignant neoplasm of breast: Secondary | ICD-10-CM | POA: Insufficient documentation

## 2021-06-05 ENCOUNTER — Ambulatory Visit: Payer: BC Managed Care – PPO | Admitting: Gastroenterology

## 2021-06-05 ENCOUNTER — Other Ambulatory Visit: Payer: Self-pay

## 2021-06-05 ENCOUNTER — Encounter: Payer: Self-pay | Admitting: Gastroenterology

## 2021-06-05 VITALS — BP 116/77 | HR 92 | Temp 98.8°F | Ht 63.0 in | Wt 164.0 lb

## 2021-06-05 DIAGNOSIS — K625 Hemorrhage of anus and rectum: Secondary | ICD-10-CM

## 2021-06-05 MED ORDER — NA SULFATE-K SULFATE-MG SULF 17.5-3.13-1.6 GM/177ML PO SOLN: 354.0000 mL | Freq: Once | ORAL | 0 refills | Status: AC

## 2021-06-05 NOTE — Addendum Note (Signed)
Addended by: Wayna Chalet on: 06/05/2021 03:07 PM   Modules accepted: Orders

## 2021-06-05 NOTE — Addendum Note (Signed)
Addended by: Wayna Chalet on: 06/05/2021 01:49 PM   Modules accepted: Orders

## 2021-06-05 NOTE — Progress Notes (Signed)
Jonathon Bellows MD, MRCP(U.K) [address]  [place]  [place], [place] [address]  Main: [phone]  Fax: [phone]   Gastroenterology Consultation  Referring Provider:     Delsa Grana, PA-C Primary Care Physician:  Delsa Grana, PA-C Primary Gastroenterologist:  Dr. Jonathon Bellows  Reason for Consultation:    Rectal bleeding        HPI:   [name] is a 46 y.o. y/o female referred for consultation & management  by Delsa Grana, PA-C.     Rectal bleeding :  Onset and where was blood seen  : December 2022, blood seen on the tissue paper, toilet bowl and mixed with stool at Frequency of bowel movements : Regular Consistency : Soft Change in shape of stool: None Pain associated with bowel movements: None Blood thinner usage: None NSAID's: None regular Prior colonoscopy : None Family history of colon cancer or polyps: None Weight loss: None   04/16/2021: Hemoglobin 13.1 g  Past Medical History:  Diagnosis Date   B12 deficiency    Menorrhagia    Vitamin D deficiency     Past Surgical History:  Procedure Laterality Date   NO PAST SURGERIES      Prior to Admission medications   Medication Sig Start Date End Date Taking? Authorizing Provider  cholecalciferol (VITAMIN D3) 25 MCG (1000 UT) tablet Take 1,000 Units by mouth daily.    [provider]  loratadine (CLARITIN) 10 MG tablet Take 10 mg by mouth daily.    [provider]  mometasone (ELOCON) 0.1 % lotion Apply topically daily. 04/16/21   Bo Merino, FNP  Triamcinolone Acetonide (TRIAMCINOLONE 0.1 % CREAM : EUCERIN) CREA Apply 1 application topically 3 (three) times daily. 04/18/21   Bo Merino, FNP    Family History  Problem Relation Age of Onset   Diabetes Mother    Rheum arthritis Mother    Hypertension Mother    Hyperlipidemia Mother    Diabetes Father    Hyperlipidemia Sister    Hyperlipidemia Sister    Breast cancer Neg Hx      Social History   Tobacco Use    Smoking status: Never   Smokeless tobacco: Never  Vaping Use   Vaping Use: Never used  Substance Use Topics   Alcohol use: Yes    Comment: occasional   Drug use: No    Allergies as of 06/05/2021 - Review Complete 04/16/2021  Allergen Reaction Noted   Penicillin g Other (See Comments) 11/29/2013   Penicillins Rash 11/29/2013   Sulfa antibiotics Rash 11/29/2013    Review of Systems:    All systems reviewed and negative except where noted in HPI.   Physical Exam:  There were no vitals taken for this visit. No LMP recorded. Psych:  Alert and cooperative. Normal mood and affect. General:   Alert,  Well-developed, well-nourished, pleasant and cooperative in NAD Head:  Normocephalic and atraumatic. Eyes:  Sclera clear, no icterus.   Conjunctiva pink. Neurologic:  Alert and oriented x3;  grossly normal neurologically. Psych:  Alert and cooperative. Normal mood and affect.  Imaging Studies: MM 3D SCREEN BREAST BILATERAL  Result Date: 05/22/2021 CLINICAL DATA:  Screening. EXAM: DIGITAL SCREENING BILATERAL MAMMOGRAM WITH TOMOSYNTHESIS AND CAD TECHNIQUE: Bilateral screening digital craniocaudal and mediolateral oblique mammograms were obtained. Bilateral screening digital breast tomosynthesis was performed. The images were evaluated with computer-aided detection. COMPARISON:  Previous exam(s). ACR Breast Density Category b: There are scattered areas of fibroglandular density. FINDINGS: There  are no findings suspicious for malignancy. IMPRESSION: No mammographic evidence of malignancy. A result letter of this screening mammogram will be mailed directly to the patient. RECOMMENDATION: Screening mammogram in one year. (Code:[id number]-01Y) BI-RADS CATEGORY  1: Negative. Electronically Signed   By: Lajean Manes M.D.   On: 05/22/2021 10:36   Assessment and Plan:   Sarah Carrillo is a 46 y.o. y/o female has been referred for rectal bleeding.  Plan 1.  Diagnostic colonoscopy 2.  Patient information  on high-fiber diet with an aim of 25 g of fiber per day, conservative management of hemorrhoids discussed as well as patient information provided.  If no better and colonoscopy confirms internal hemorrhoids could consider banding at the office in the future.   I have discussed alternative options, risks & benefits,  which include, but are not limited to, bleeding, infection, perforation,respiratory complication & drug reaction.  The patient agrees with this plan & written consent will be obtained.     Follow up in as needed  Dr Jonathon Bellows MD,MRCP(U.K)

## 2021-06-05 NOTE — Patient Instructions (Signed)
High-Fiber Eating Plan °Fiber, also called dietary fiber, is a type of carbohydrate. It is found foods such as fruits, vegetables, whole grains, and beans. A high-fiber diet can have many health benefits. Your health care provider may recommend a high-fiber diet to help: °Prevent constipation. Fiber can make your bowel movements more regular. °Lower your cholesterol. °Relieve the following conditions: °Inflammation of veins in the anus (hemorrhoids). °Inflammation of specific areas of the digestive tract (uncomplicated diverticulosis). °A problem of the large intestine, also called the colon, that sometimes causes pain and diarrhea (irritable bowel syndrome, or IBS). °Prevent overeating as part of a weight-loss plan. °Prevent heart disease, type 2 diabetes, and certain cancers. °What are tips for following this plan? °Reading food labels ° °Check the nutrition facts label on food products for the amount of dietary fiber. Choose foods that have 5 grams of fiber or more per serving. °The goals for recommended daily fiber intake include: °Men (age 50 or younger): 34-38 g. °Men (over age 50): 28-34 g. °Women (age 50 or younger): 25-28 g. °Women (over age 50): 22-25 g. °Your daily fiber goal is _____________ g. °Shopping °Choose whole fruits and vegetables instead of processed forms, such as apple juice or applesauce. °Choose a wide variety of high-fiber foods such as avocados, lentils, oats, and kidney beans. °Read the nutrition facts label of the foods you choose. Be aware of foods with added fiber. These foods often have high sugar and sodium amounts per serving. °Cooking °Use whole-grain flour for baking and cooking. °Cook with brown rice instead of white rice. °Meal planning °Start the day with a breakfast that is high in fiber, such as a cereal that contains 5 g of fiber or more per serving. °Eat breads and cereals that are made with whole-grain flour instead of refined flour or white flour. °Eat brown rice, bulgur  wheat, or millet instead of white rice. °Use beans in place of meat in soups, salads, and pasta dishes. °Be sure that half of the grains you eat each day are whole grains. °General information °You can get the recommended daily intake of dietary fiber by: °Eating a variety of fruits, vegetables, grains, nuts, and beans. °Taking a fiber supplement if you are not able to take in enough fiber in your diet. It is better to get fiber through food than from a supplement. °Gradually increase how much fiber you consume. If you increase your intake of dietary fiber too quickly, you may have bloating, cramping, or gas. °Drink plenty of water to help you digest fiber. °Choose high-fiber snacks, such as berries, raw vegetables, nuts, and popcorn. °What foods should I eat? °Fruits °Berries. Pears. Apples. Oranges. Avocado. Prunes and raisins. Dried figs. °Vegetables °Sweet potatoes. Spinach. Kale. Artichokes. Cabbage. Broccoli. Cauliflower. Green peas. Carrots. Squash. °Grains °Whole-grain breads. Multigrain cereal. Oats and oatmeal. Brown rice. Barley. Bulgur wheat. Millet. Quinoa. Bran muffins. Popcorn. Rye wafer crackers. °Meats and other proteins °Navy beans, kidney beans, and pinto beans. Soybeans. Split peas. Lentils. Nuts and seeds. °Dairy °Fiber-fortified yogurt. °Beverages °Fiber-fortified soy milk. Fiber-fortified orange juice. °Other foods °Fiber bars. °The items listed above may not be a complete list of recommended foods and beverages. Contact a dietitian for more information. °What foods should I avoid? °Fruits °Fruit juice. Cooked, strained fruit. °Vegetables °Fried potatoes. Canned vegetables. Well-cooked vegetables. °Grains °White bread. Pasta made with refined flour. White rice. °Meats and other proteins °Fatty cuts of meat. Fried chicken or fried fish. °Dairy °Milk. Yogurt. Cream cheese. Sour cream. °Fats and   oils Butters. Beverages Soft drinks. Other foods Cakes and pastries. The items listed above may  not be a complete list of foods and beverages to avoid. Talk with your dietitian about what choices are best for you. Summary Fiber is a type of carbohydrate. It is found in foods such as fruits, vegetables, whole grains, and beans. A high-fiber diet has many benefits. It can help to prevent constipation, lower blood cholesterol, aid weight loss, and reduce your risk of heart disease, diabetes, and certain cancers. Increase your intake of fiber gradually. Increasing fiber too quickly may cause cramping, bloating, and gas. Drink plenty of water while you increase the amount of fiber you consume. The best sources of fiber include whole fruits and vegetables, whole grains, nuts, seeds, and beans. This information is not intended to replace advice given to you by your health care provider. Make sure you discuss any questions you have with your health care provider. Document Revised: 07/29/2019 Document Reviewed: 07/29/2019 Elsevier Patient Education  2022 [place].  Hemorrhoids Hemorrhoids are swollen veins that may develop: In the butt (rectum). These are called internal hemorrhoids. Around the opening of the butt (anus). These are called external hemorrhoids. Hemorrhoids can cause pain, itching, or bleeding. Most of the time, they do not cause serious problems. They usually get better with diet changes, lifestyle changes, and other home treatments. What are the causes? This condition may be caused by: Having trouble pooping (constipation). Pushing hard (straining) to poop. Watery poop (diarrhea). Pregnancy. Being very overweight (obese). Sitting for long periods of time. Heavy lifting or other activity that causes you to strain. Anal sex. Riding a bike for a long period of time. What are the signs or symptoms? Symptoms of this condition include: Pain. Itching or soreness in the butt. Bleeding from the butt. Leaking poop. Swelling in the area. One or more lumps around the opening  of your butt. How is this diagnosed? A doctor can often diagnose this condition by looking at the affected area. The doctor may also: Do an exam that involves feeling the area with a gloved hand (digital rectal exam). Examine the area inside your butt using a small tube (anoscope). Order blood tests. This may be done if you have lost a lot of blood. Have you get a test that involves looking inside the colon using a flexible tube with a camera on the end (sigmoidoscopy or colonoscopy). How is this treated? This condition can usually be treated at home. Your doctor may tell you to change what you eat, make lifestyle changes, or try home treatments. If these do not help, procedures can be done to remove the hemorrhoids or make them smaller. These may involve: Placing rubber bands at the base of the hemorrhoids to cut off their blood supply. Injecting medicine into the hemorrhoids to shrink them. Shining a type of light energy onto the hemorrhoids to cause them to fall off. Doing surgery to remove the hemorrhoids or cut off their blood supply. Follow these instructions at home: Eating and drinking  Eat foods that have a lot of fiber in them. These include whole grains, beans, nuts, fruits, and vegetables. Ask your doctor about taking products that have added fiber (fibersupplements). Reduce the amount of fat in your diet. You can do this by: Eating low-fat dairy products. Eating less red meat. Avoiding processed foods. Drink enough fluid to keep your pee (urine) pale yellow. Managing pain and swelling  Take a warm-water bath (sitz bath) for 20 minutes to  ease pain. Do this 3-4 times a day. You may do this in a bathtub or using a portable sitz bath that fits over the toilet. If told, put ice on the painful area. It may be helpful to use ice between your warm baths. Put ice in a plastic bag. Place a towel between your skin and the bag. Leave the ice on for 20 minutes, 2-3 times a day. General  instructions Take over-the-counter and prescription medicines only as told by your doctor. Medicated creams and medicines may be used as told. Exercise often. Ask your doctor how much and what kind of exercise is best for you. Go to the bathroom when you have the urge to poop. Do not wait. Avoid pushing too hard when you poop. Keep your butt dry and clean. Use wet toilet paper or moist towelettes after pooping. Do not sit on the toilet for a long time. Keep all follow-up visits as told by your doctor. This is important. Contact a doctor if you: Have pain and swelling that do not get better with treatment or medicine. Have trouble pooping. Cannot poop. Have pain or swelling outside the area of the hemorrhoids. Get help right away if you have: Bleeding that will not stop. Summary Hemorrhoids are swollen veins in the butt or around the opening of the butt. They can cause pain, itching, or bleeding. Eat foods that have a lot of fiber in them. These include whole grains, beans, nuts, fruits, and vegetables. Take a warm-water bath (sitz bath) for 20 minutes to ease pain. Do this 3-4 times a day. This information is not intended to replace advice given to you by your health care provider. Make sure you discuss any questions you have with your health care provider. Document Revised: 10/04/2020 Document Reviewed: 10/04/2020 Elsevier Patient Education  [place].

## 2021-06-07 ENCOUNTER — Ambulatory Visit
Admission: RE | Admit: 2021-06-07 | Discharge: 2021-06-07 | Disposition: A | Discharge status: Home / Self Care | Payer: BC Managed Care – PPO | Attending: Gastroenterology | Admitting: Gastroenterology

## 2021-06-07 ENCOUNTER — Encounter
Admission: RE | Disposition: A | Discharge status: Home / Self Care | Payer: Self-pay | Source: Home / Self Care | Attending: Gastroenterology

## 2021-06-07 ENCOUNTER — Ambulatory Visit: Payer: BC Managed Care – PPO | Admitting: Anesthesiology

## 2021-06-07 ENCOUNTER — Encounter: Payer: Self-pay | Admitting: Gastroenterology

## 2021-06-07 DIAGNOSIS — K625 Hemorrhage of anus and rectum: Secondary | ICD-10-CM

## 2021-06-07 DIAGNOSIS — K640 First degree hemorrhoids: Secondary | ICD-10-CM | POA: Insufficient documentation

## 2021-06-07 DIAGNOSIS — E559 Vitamin D deficiency, unspecified: Secondary | ICD-10-CM | POA: Insufficient documentation

## 2021-06-07 DIAGNOSIS — D124 Benign neoplasm of descending colon: Secondary | ICD-10-CM | POA: Diagnosis not present

## 2021-06-07 DIAGNOSIS — K635 Polyp of colon: Secondary | ICD-10-CM | POA: Diagnosis not present

## 2021-06-07 DIAGNOSIS — K648 Other hemorrhoids: Secondary | ICD-10-CM | POA: Diagnosis not present

## 2021-06-07 DIAGNOSIS — D125 Benign neoplasm of sigmoid colon: Secondary | ICD-10-CM | POA: Diagnosis not present

## 2021-06-07 HISTORY — PX: COLONOSCOPY WITH PROPOFOL: SHX5780

## 2021-06-07 LAB — POCT PREGNANCY, URINE: Preg Test, Ur: NEGATIVE

## 2021-06-07 SURGERY — COLONOSCOPY WITH PROPOFOL
Anesthesia: General

## 2021-06-07 MED ORDER — PROPOFOL 10 MG/ML IV BOLUS
INTRAVENOUS | Status: DC | PRN
  Administered 2021-06-07: 70 mg via INTRAVENOUS

## 2021-06-07 MED ORDER — SEVOFLURANE IN SOLN
RESPIRATORY_TRACT | Status: AC
  Filled 2021-06-07: qty 250

## 2021-06-07 MED ORDER — PROPOFOL 500 MG/50ML IV EMUL
INTRAVENOUS | Status: DC | PRN
  Administered 2021-06-07: 140 ug/kg/min via INTRAVENOUS

## 2021-06-07 MED ORDER — PROPOFOL 500 MG/50ML IV EMUL
INTRAVENOUS | Status: AC
  Filled 2021-06-07: qty 200

## 2021-06-07 MED ORDER — EPHEDRINE 5 MG/ML INJ
INTRAVENOUS | Status: AC
  Filled 2021-06-07: qty 5

## 2021-06-07 MED ORDER — SODIUM CHLORIDE 0.9 % IV SOLN: INTRAVENOUS | Status: DC

## 2021-06-07 MED ORDER — PROPOFOL 1000 MG/100ML IV EMUL
INTRAVENOUS | Status: AC
  Filled 2021-06-07: qty 100

## 2021-06-07 MED ORDER — PROPOFOL 500 MG/50ML IV EMUL
INTRAVENOUS | Status: AC
  Filled 2021-06-07: qty 100

## 2021-06-07 NOTE — Anesthesia Postprocedure Evaluation (Signed)
Anesthesia Post Note ? ?Patient: Sarah Carrillo ? ?Procedure(s) Performed: COLONOSCOPY WITH PROPOFOL ? ?Patient location during evaluation: PACU ?Anesthesia Type: General ?Level of consciousness: awake, awake and alert and oriented ?Pain management: pain level controlled ?Vital Signs Assessment: post-procedure vital signs reviewed and stable ?Respiratory status: spontaneous breathing and nonlabored ventilation ?Anesthetic complications: no ? ? ?No notable events documented. ? ? ?Last Vitals:  ?Vitals:  ? 06/07/21 1235 06/07/21 1245  ?BP: 118/90 (!) 136/91  ?Pulse: 80 73  ?Resp: 14 15  ?Temp:    ?SpO2: 100% 100%  ?  ?Last Pain:  ?Vitals:  ? 06/07/21 1245  ?TempSrc:   ?PainSc: 0-No pain  ? ? ?  ?  ?  ?  ?  ?  ? ?VAN STAVEREN,Tashaya Ancrum ? ? ? ? ?

## 2021-06-07 NOTE — Transfer of Care (Signed)
Immediate Anesthesia Transfer of Care Note ? ?Patient: Sarah Carrillo ? ?Procedure(s) Performed: COLONOSCOPY WITH PROPOFOL ? ?Patient Location: PACU ? ?Anesthesia Type:General ? ?Level of Consciousness: awake, alert  and oriented ? ?Airway & Oxygen Therapy: Patient Spontanous Breathing ? ?Post-op Assessment: Report given to RN and Post -op Vital signs reviewed and stable ? ?Post vital signs: Reviewed and stable ? ?Last Vitals:  ?Vitals Value Taken Time  ?BP    ?Temp 36.1 ?C 06/07/21 1225  ?Pulse 94 06/07/21 1225  ?Resp 19 06/07/21 1225  ?SpO2 98 % 06/07/21 1225  ? ? ?Last Pain:  ?Vitals:  ? 06/07/21 1225  ?TempSrc: Temporal  ?   ? ?  ? ?Complications: No notable events documented. ?

## 2021-06-07 NOTE — H&P (Signed)
? ? ? ?Jonathon Bellows, MD ?[address] [place], [place], [place], [address] ?[address], [place], [place], [place], [address] ?Phone: [phone]  ?Fax: [phone] ? ?Primary Care Physician:  Delsa Grana, PA-C ? ? ?Pre-Procedure History & Physical: ?HPI:  Sarah Carrillo is a 46 y.o. female is here for an colonoscopy. ?  ?Past Medical History:  ?Diagnosis Date  ? B12 deficiency   ? Menorrhagia   ? Vitamin D deficiency   ? ? ?Past Surgical History:  ?Procedure Laterality Date  ? NO PAST SURGERIES    ? ? ?Prior to Admission medications   ?Medication Sig Start Date End Date Taking? Authorizing Provider  ?cetirizine (ZYRTEC) 10 MG tablet Take 10 mg by mouth daily.   Yes [provider]  ?cholecalciferol (VITAMIN D3) 25 MCG (1000 UT) tablet Take 1,000 Units by mouth daily.   Yes [provider]  ?Probiotic Product (PROBIOTIC-10 PO) Take by mouth.    [provider]  ? ? ?Allergies as of 06/05/2021 - Review Complete 06/05/2021  ?Allergen Reaction Noted  ? Penicillin g Other (See Comments) 11/29/2013  ? Penicillins Rash 11/29/2013  ? Sulfa antibiotics Rash 11/29/2013  ? ? ?Family History  ?Problem Relation Age of Onset  ? Diabetes Mother   ? Rheum arthritis Mother   ? Hypertension Mother   ? Hyperlipidemia Mother   ? Diabetes Father   ? Hyperlipidemia Sister   ? Hyperlipidemia Sister   ? Breast cancer Neg Hx   ? ? ?Social History  ? ?Socioeconomic History  ? Marital status: Married  ?  Spouse name: Sarah Carrillo  ? Number of children: 2  ? Years of education: Not on file  ? Highest education level: Not on file  ?Occupational History  ? Not on file  ?Tobacco Use  ? Smoking status: Never  ? Smokeless tobacco: Never  ?Vaping Use  ? Vaping Use: Never used  ?Substance and Sexual Activity  ? Alcohol use: Yes  ?  Comment: occasional  ? Drug use: No  ? Sexual activity: Yes  ?  Partners: Male  ?  Birth control/protection: None  ?Other Topics Concern  ? Not on file  ?Social History Narrative  ? Married -  Husband's name is Sarah Carrillo, Sarah Carrillo; Son is Sarah Carrillo (19yo), Daughter Sarah Carrillo (58) in college.   ? 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  ? ?Social Determinants of Health  ? ?Financial Resource Strain: Not on file  ?Food Insecurity: Not on file  ?Transportation Needs: Not on file  ?Physical Activity: Not on file  ?Stress: Not on file  ?Social Connections: Not on file  ?Intimate Partner Violence: Not on file  ? ? ?Review of Systems: ?See HPI, otherwise negative ROS ? ?Physical Exam: ?BP (!) 123/91   Pulse 96   Temp (!) 96.9 ?F (36.1 ?C) (Temporal)   Resp 18   Ht 5\' 3"  (1.6 m)   Wt 70.8 kg   SpO2 100%   BMI 27.63 kg/m?  ?General:   Alert,  pleasant and cooperative in NAD ?Head:  Normocephalic and atraumatic. ?Neck:  Supple; no masses or thyromegaly. ?Lungs:  Clear throughout to auscultation, normal respiratory effort.    ?Heart:  +S1, +S2, Regular rate and rhythm, No edema. ?Abdomen:  Soft, nontender and nondistended. Normal bowel sounds, without guarding, and without rebound.   ?Neurologic:  Alert and  oriented x4;  grossly normal neurologically. ? ?Impression/Plan: ?Sarah Carrillo is here for an colonoscopy to be performed for rectal bleeding.  Risks, benefits, limitations, and alternatives regarding  colonoscopy have been reviewed with the patient.  Questions have been answered.  All parties agreeable. ? ? ?Jonathon Bellows, MD  06/07/2021, 12:00 PM  ?

## 2021-06-07 NOTE — Anesthesia Preprocedure Evaluation (Signed)
Anesthesia Evaluation  ?Patient identified by MRN, date of birth, ID band ?Patient awake ? ? ? ?Reviewed: ?Allergy & Precautions, NPO status , Patient's Chart, lab work & pertinent test results ? ?Airway ?Mallampati: II ? ?TM Distance: >3 FB ?Neck ROM: Full ? ? ? Dental ? ?(+) Teeth Intact ?  ?Pulmonary ?neg pulmonary ROS,  ?  ?Pulmonary exam normal ?breath sounds clear to auscultation ? ? ? ? ? ? Cardiovascular ?Exercise Tolerance: Good ?negative cardio ROS ?Normal cardiovascular exam ?Rhythm:Regular  ? ?  ?Neuro/Psych ?negative neurological ROS ? negative psych ROS  ? GI/Hepatic ?negative GI ROS, Neg liver ROS,   ?Endo/Other  ?negative endocrine ROS ? Renal/GU ?negative Renal ROS  ?negative genitourinary ?  ?Musculoskeletal ?negative musculoskeletal ROS ?(+)  ? Abdominal ?Normal abdominal exam  (+)   ?Peds ?negative pediatric ROS ?(+)  Hematology ?negative hematology ROS ?(+)   ?Anesthesia Other Findings ?Past Medical History: ?No date: B12 deficiency ?No date: Menorrhagia ?No date: Vitamin D deficiency ? ?Past Surgical History: ?No date: NO PAST SURGERIES ? ?BMI   ? Body Mass Index: 27.63 kg/m?  ?  ? ? Reproductive/Obstetrics ?negative OB ROS ? ?  ? ? ? ? ? ? ? ? ? ? ? ? ? ?  ?  ? ? ? ? ? ? ? ? ?Anesthesia Physical ?Anesthesia Plan ? ?ASA: 2 ? ?Anesthesia Plan: General  ? ?Post-op Pain Management:   ? ?Induction: Intravenous ? ?PONV Risk Score and Plan: Propofol infusion and TIVA ? ?Airway Management Planned: Natural Airway and Nasal Cannula ? ?Additional Equipment:  ? ?Intra-op Plan:  ? ?Post-operative Plan:  ? ?Informed Consent: I have reviewed the patients History and Physical, chart, labs and discussed the procedure including the risks, benefits and alternatives for the proposed anesthesia with the patient or authorized representative who has indicated his/her understanding and acceptance.  ? ? ? ?Dental Advisory Given ? ?Plan Discussed with: CRNA and Surgeon ? ?Anesthesia  Plan Comments:   ? ? ? ? ? ? ?Anesthesia Quick Evaluation ? ?

## 2021-06-07 NOTE — Op Note (Signed)
[facility] ?Gastroenterology ?Patient Name: Sarah Carrillo ?Procedure Date: 06/07/2021 12:01 PM ?MRN: [id number] ?Account #: [id number] ?Date of Birth: 04-18-1975 ?Admit Type: Outpatient ?Age: 46 ?Room: [facility] ENDO ROOM 4 ?Gender: Female ?Note Status: Finalized ?Instrument Name: Colonscope [id number] ?Procedure:             Colonoscopy ?Indications:           Rectal bleeding ?Providers:             Jonathon Bellows MD, MD ?Referring MD:          Delsa Grana (Referring MD) ?Medicines:             Monitored Anesthesia Care ?Complications:         No immediate complications. ?Procedure:             Pre-Anesthesia Assessment: ?                       - Prior to the procedure, a History and Physical was  ?                       performed, and patient medications, allergies and  ?                       sensitivities were reviewed. The patient's tolerance  ?                       of previous anesthesia was reviewed. ?                       - The risks and benefits of the procedure and the  ?                       sedation options and risks were discussed with the  ?                       patient. All questions were answered and informed  ?                       consent was obtained. ?                       - ASA Grade Assessment: II - A patient with mild  ?                       systemic disease. ?                       After obtaining informed consent, the colonoscope was  ?                       passed under direct vision. Throughout the procedure,  ?                       the patient's blood pressure, pulse, and oxygen  ?                       saturations were monitored continuously. The  ?                       Colonoscope was introduced through the anus and  ?  advanced to the the cecum, identified by the  ?                       appendiceal orifice. The colonoscopy was performed  ?                       with ease. The patient tolerated the procedure well.  ?                       The quality  of the bowel preparation was good. ?Findings: ?     The perianal and digital rectal examinations were normal. ?     Two sessile polyps were found in the sigmoid colon and descending colon.  ?     The polyps were 7 to 9 mm in size. These polyps were removed with a cold  ?     snare. Resection and retrieval were complete. ?     Non-bleeding internal hemorrhoids were found during retroflexion. The  ?     hemorrhoids were large and Grade I (internal hemorrhoids that do not  ?     prolapse). ?     The exam was otherwise without abnormality on direct and retroflexion  ?     views. ?Impression:            - Two 7 to 9 mm polyps in the sigmoid colon and in the  ?                       descending colon, removed with a cold snare. Resected  ?                       and retrieved. ?                       - Non-bleeding internal hemorrhoids. ?                       - The examination was otherwise normal on direct and  ?                       retroflexion views. ?Recommendation:        - Discharge patient to home (with escort). ?                       - Resume previous diet. ?                       - Continue present medications. ?                       - Await pathology results. ?                       - Repeat colonoscopy in 5 years for surveillance. ?                       - Refer to surgeon for hemorrhoid banding in 4 weeks. ?Procedure Code(s):     --- Professional --- ?                       [id number], Colonoscopy, flexible; with removal of  ?  tumor(s), polyp(s), or other lesion(s) by snare  ?                       technique ?Diagnosis Code(s):     --- Professional --- ?                       K63.5, Polyp of colon ?                       K64.0, First degree hemorrhoids ?                       K62.5, Hemorrhage of anus and rectum ?CPT copyright 2019 American Medical Association. All rights reserved. ?The codes documented in this report are preliminary and upon coder review may  ?be revised to meet current  compliance requirements. ?Jonathon Bellows, MD ?Jonathon Bellows MD, MD ?06/07/2021 12:26:03 PM ?This report has been signed electronically. ?Number of Addenda: 0 ?Note Initiated On: 06/07/2021 12:01 PM ?Scope Withdrawal Time: 0 hours 10 minutes 30 seconds  ?Total Procedure Duration: 0 hours 14 minutes 34 seconds  ?Estimated Blood Loss:  Estimated blood loss: none. ?     [facility] ?

## 2021-06-08 ENCOUNTER — Encounter: Payer: Self-pay | Admitting: Gastroenterology

## 2021-06-08 LAB — SURGICAL PATHOLOGY

## 2021-06-11 ENCOUNTER — Encounter: Payer: Self-pay | Admitting: Gastroenterology

## 2021-06-11 NOTE — Progress Notes (Signed)
Office visit for hemorroidal banding please

## 2021-06-19 ENCOUNTER — Telehealth: Payer: Self-pay

## 2021-06-19 NOTE — Telephone Encounter (Signed)
Called patient to let her know that Dr. Vicente Males is recommending for her to have hemorrhoid bandings. Patient stated that she stays busy but would like to go ahead and schedule it in My. Therefore, she chose to schedule it on Aug 14, 2021. ?

## 2021-06-19 NOTE — Telephone Encounter (Signed)
-----   Message from Jonathon Bellows, MD sent at 06/11/2021 10:01 AM EST ----- ?Office visit for hemorroidal banding please  ?

## 2021-07-23 ENCOUNTER — Encounter: Payer: Self-pay | Admitting: Family Medicine

## 2021-07-23 ENCOUNTER — Ambulatory Visit: Payer: BC Managed Care – PPO | Admitting: Family Medicine

## 2021-07-23 VITALS — BP 104/72 | HR 81 | Temp 98.1°F | Resp 16 | Ht 63.0 in | Wt 165.8 lb

## 2021-07-23 DIAGNOSIS — Z7184 Encounter for health counseling related to travel: Secondary | ICD-10-CM

## 2021-07-23 DIAGNOSIS — L301 Dyshidrosis [pompholyx]: Secondary | ICD-10-CM | POA: Diagnosis not present

## 2021-07-23 DIAGNOSIS — Z298 Encounter for other specified prophylactic measures: Secondary | ICD-10-CM | POA: Diagnosis not present

## 2021-07-23 MED ORDER — DOXYCYCLINE HYCLATE 100 MG PO CAPS: 100.0000 mg | ORAL_CAPSULE | Freq: Every day | ORAL | 0 refills | Status: AC

## 2021-07-23 MED ORDER — TRIAMCINOLONE ACETONIDE 0.1 % EX CREA: 1.0000 "application " | TOPICAL_CREAM | Freq: Two times a day (BID) | CUTANEOUS | 1 refills | Status: DC | PRN

## 2021-07-23 NOTE — Progress Notes (Signed)
? ? ?Patient ID: Sarah Carrillo, female    DOB: 11/25/1975, 46 y.o.   MRN: [id number] ? ?PCP: Delsa Grana, PA-C ? ?Chief Complaint  ?Patient presents with  ? New Med Request  ?  Pt is traveling to [place] would like a antimalaria medication and any recommendations for immunizations needed for her trip.  ? ? ?Subjective:  ? ?Sarah Carrillo is a 46 y.o. female, presents to clinic with CC of the following: ? ?Patient presents asking for malaria prophylaxis and medical advice about vaccinations and other medical considerations when traveling to [place]. ? ?She has traveled to [place] previously however is 20 years ago.  At that time she did get a bunch of vaccinations however she does not have record of those or any childhood immunization records.  Reviewed her records in state database and in this EMR.  She wants to get a COVID booster but she is traveling in 2 days.  Last booster was the biValent shot which she got about 7 months ago in September ? ?She inquires about prescription medications in case she gets diarrhea. ? ?She also request med refill for steroid medication for her flexural and dyshidrotic eczema.  She specifically asked for a cream.  States her rash tends to flareup when she is traveling. ? ?Patient Active Problem List  ? Diagnosis Date Noted  ? Mixed hyperlipidemia 01/26/2020  ? Leiomyoma 06/16/2019  ? Right hip pain 05/01/2018  ? Chronic bilateral low back pain with bilateral sciatica 05/01/2018  ? Overweight (BMI 25.0-29.9) 05/01/2018  ? Neck pain 05/01/2018  ? Trapezius strain 05/01/2018  ? Dyshidrotic eczema 05/01/2018  ? B12 deficiency   ? Vitamin D deficiency 12/25/2016  ? ? ? ? ?Current Outpatient Medications:  ?  cetirizine (ZYRTEC) 10 MG tablet, Take 10 mg by mouth daily., Disp: , Rfl:  ?  cholecalciferol (VITAMIN D3) 25 MCG (1000 UT) tablet, Take 1,000 Units by mouth daily., Disp: , Rfl:  ?  doxycycline (VIBRAMYCIN) 100 MG capsule, Take 1 capsule (100 mg total) by mouth daily., Disp: 42 capsule,  Rfl: 0 ?  Probiotic Product (PROBIOTIC-10 PO), Take by mouth., Disp: , Rfl:  ?  triamcinolone cream (KENALOG) 0.1 %, Apply 1 application. topically 2 (two) times daily as needed (eczema rash areas)., Disp: 45 g, Rfl: 1 ? ? ?Allergies  ?Allergen Reactions  ? Penicillin G Other (See Comments)  ? Penicillins Rash  ? Sulfa Antibiotics Rash  ? ? ? ?Social History  ? ?Tobacco Use  ? Smoking status: Never  ? Smokeless tobacco: Never  ?Vaping Use  ? Vaping Use: Never used  ?Substance Use Topics  ? Alcohol use: Yes  ?  Comment: occasional  ? Drug use: No  ?  ? ? ?Chart Review Today: ?I personally reviewed active problem list, medication list, allergies, family history, social history, health maintenance, notes from last encounter, lab results, imaging with the patient/caregiver today. ? ? ?Review of Systems  ?Constitutional: Negative.   ?HENT: Negative.    ?Eyes: Negative.   ?Respiratory: Negative.    ?Cardiovascular: Negative.   ?Gastrointestinal: Negative.   ?Endocrine: Negative.   ?Genitourinary: Negative.   ?Musculoskeletal: Negative.   ?Skin: Negative.   ?Allergic/Immunologic: Negative.   ?Neurological: Negative.   ?Hematological: Negative.   ?Psychiatric/Behavioral: Negative.    ?All other systems reviewed and are negative. ? ?   ?Objective:  ? ?Vitals:  ? 07/23/21 0925  ?BP: 104/72  ?Pulse: 81  ?Resp: 16  ?Temp: 98.1 ?F (36.7 ?C)  ?  TempSrc: Oral  ?SpO2: 100%  ?Weight: 165 lb 12.8 oz (75.2 kg)  ?Height: '5\' 3"'$  (1.6 m)  ?  ?Body mass index is 29.37 kg/m?. ? ?Physical Exam ?Vitals and nursing note reviewed.  ?Constitutional:   ?   General: She is not in acute distress. ?   Appearance: Normal appearance. She is well-developed and normal weight. She is not ill-appearing, toxic-appearing or diaphoretic.  ?HENT:  ?   Head: Normocephalic and atraumatic.  ?   Nose: Nose normal.  ?Eyes:  ?   General: No scleral icterus.    ?   Right eye: No discharge.     ?   Left eye: No discharge.  ?   Conjunctiva/sclera: Conjunctivae normal.   ?Neck:  ?   Trachea: No tracheal deviation.  ?Cardiovascular:  ?   Rate and Rhythm: Normal rate.  ?Pulmonary:  ?   Effort: Pulmonary effort is normal. No respiratory distress.  ?   Breath sounds: No stridor.  ?Musculoskeletal:     ?   General: Normal range of motion.  ?Skin: ?   General: Skin is warm and dry.  ?   Findings: No lesion or rash.  ?Neurological:  ?   Mental Status: She is alert.  ?   Motor: No abnormal muscle tone.  ?   Coordination: Coordination normal.  ?   Gait: Gait normal.  ?Psychiatric:     ?   Mood and Affect: Mood normal.     ?   Behavior: Behavior normal.  ?  ? ?Results for orders placed or performed during the hospital encounter of 06/07/21  ?Pregnancy, urine POC  ?Result Value Ref Range  ? Preg Test, Ur NEGATIVE NEGATIVE  ?Surgical pathology  ?Result Value Ref Range  ? SURGICAL PATHOLOGY    ?  SURGICAL PATHOLOGY ?CASE: [id number] ?PATIENT: Tammy Eriksen ?Surgical Pathology Report ? ? ? ? ?Specimen Submitted: ?A. Colon polyp, descending; cold snare ?B. Colon polyp, sigmoid; cold snare ? ?Clinical History: Rectal bleeding K62.5.  Colon polyps ? ? ? ?DIAGNOSIS: ?A. COLON POLYP, DESCENDING; COLD SNARE: ?- SESSILE SERRATED POLYP. ?- NEGATIVE FOR DYSPLASIA AND MALIGNANCY. ? ?B. COLON POLYP, SIGMOID; COLD SNARE: ?- HYPERPLASTIC POLYP. ?- NEGATIVE FOR DYSPLASIA AND MALIGNANCY. ? ?GROSS DESCRIPTION: ?A. Labeled: Cold snared descending colon polyp ?Received: Formalin ?Collection time: 11:45 AM on 06/07/2021 ?Placed into formalin time: 11:45 AM on 06/07/2021 ?Tissue fragment(s): Multiple ?Size: Aggregate, 1.0 x 0.4 x 0.1 cm ?Description: Received is a tan soft tissue fragment, admixed with ?intestinal debris.  The ratio of soft tissue to intestinal debris is 70: ?30. ?Entirely submitted in 1 cassette. ? ?B. Labeled: Cold snared sigmoid colon polyp ?Received: Formalin ?Collection time: 12 :20 PM on 06/07/2021 ?Placed into formalin time: 12:20 PM on 06/07/2021 ?Tissue fragment(s): 1 ?Size: 0.5 x 0.5 x 0.1  cm ?Description: Tan soft tissue fragment ?Entirely submitted in 1 cassette. ? ?CM 06/07/2021 ? ?Final Diagnosis performed by Allena Napoleon, MD.   Electronically signed ?06/08/2021 10:20:45AM ?The electronic signature indicates that the named Attending Pathologist ?has evaluated the specimen ?Technical component performed at [facility], [address] [place], ?[place] [address] Lab: [phone] Dir: Rush Farmer, MD, MMM ? Professional component performed at [facility], [facility], [place], [place], [place] [address] Lab: [phone] ?Dir: Kathi Simpers, MD ?  ? ? ?   ?Assessment & Plan:  ? ?1. Dyshidrotic eczema ? ?- triamcinolone cream (KENALOG) 0.1 %; Apply 1 application. topically 2 (two) times daily as needed (eczema rash  areas).  Dispense: 45 g; Refill: 1 ? ?2. Need for malaria prophylaxis ?Review medications, indications, how to use, how to avoid side effects with doxycycline including to take with food, drink a full glass of water after taking medications and sitting upright for at least 30 minutes ?Reviewed travel recommendations for going to [place]  ?- doxycycline (VIBRAMYCIN) 100 MG capsule; Take 1 capsule (100 mg total) by mouth daily.  Dispense: 42 capsule; Refill: 0 ? ?3. Travel advice encounter ?Reviewed CDC and WHO recommendations for vaccinations for travel to [place]  ?Pt does not have prior immunization records with her -the state database immunization record was pulled and reviewed with the patient.  I did look up travel clinics for her in the area.  ? ? ? ? ? ?Delsa Grana, PA-C ?07/23/21 10:01 AM ? ? ?

## 2021-08-14 ENCOUNTER — Ambulatory Visit: Payer: Self-pay | Admitting: Gastroenterology

## 2021-08-14 ENCOUNTER — Encounter: Payer: Self-pay | Admitting: Gastroenterology

## 2021-08-14 VITALS — Wt 163.2 lb

## 2021-08-14 DIAGNOSIS — K648 Other hemorrhoids: Secondary | ICD-10-CM

## 2021-08-14 NOTE — Patient Instructions (Signed)
Hemorrhoids Hemorrhoids are swollen veins that may develop: In the butt (rectum). These are called internal hemorrhoids. Around the opening of the butt (anus). These are called external hemorrhoids. Hemorrhoids can cause pain, itching, or bleeding. Most of the time, they do not cause serious problems. They usually get better with diet changes, lifestyle changes, and other home treatments. What are the causes? This condition may be caused by: Having trouble pooping (constipation). Pushing hard (straining) to poop. Watery poop (diarrhea). Pregnancy. Being very overweight (obese). Sitting for long periods of time. Heavy lifting or other activity that causes you to strain. Anal sex. Riding a bike for a long period of time. What are the signs or symptoms? Symptoms of this condition include: Pain. Itching or soreness in the butt. Bleeding from the butt. Leaking poop. Swelling in the area. One or more lumps around the opening of your butt. How is this diagnosed? A doctor can often diagnose this condition by looking at the affected area. The doctor may also: Do an exam that involves feeling the area with a gloved hand (digital rectal exam). Examine the area inside your butt using a small tube (anoscope). Order blood tests. This may be done if you have lost a lot of blood. Have you get a test that involves looking inside the colon using a flexible tube with a camera on the end (sigmoidoscopy or colonoscopy). How is this treated? This condition can usually be treated at home. Your doctor may tell you to change what you eat, make lifestyle changes, or try home treatments. If these do not help, procedures can be done to remove the hemorrhoids or make them smaller. These may involve: Placing rubber bands at the base of the hemorrhoids to cut off their blood supply. Injecting medicine into the hemorrhoids to shrink them. Shining a type of light energy onto the hemorrhoids to cause them to fall  off. Doing surgery to remove the hemorrhoids or cut off their blood supply. Follow these instructions at home: Eating and drinking  Eat foods that have a lot of fiber in them. These include whole grains, beans, nuts, fruits, and vegetables. Ask your doctor about taking products that have added fiber (fibersupplements). Reduce the amount of fat in your diet. You can do this by: Eating low-fat dairy products. Eating less red meat. Avoiding processed foods. Drink enough fluid to keep your pee (urine) pale yellow. Managing pain and swelling  Take a warm-water bath (sitz bath) for 20 minutes to ease pain. Do this 3-4 times a day. You may do this in a bathtub or using a portable sitz bath that fits over the toilet. If told, put ice on the painful area. It may be helpful to use ice between your warm baths. Put ice in a plastic bag. Place a towel between your skin and the bag. Leave the ice on for 20 minutes, 2-3 times a day. General instructions Take over-the-counter and prescription medicines only as told by your doctor. Medicated creams and medicines may be used as told. Exercise often. Ask your doctor how much and what kind of exercise is best for you. Go to the bathroom when you have the urge to poop. Do not wait. Avoid pushing too hard when you poop. Keep your butt dry and clean. Use wet toilet paper or moist towelettes after pooping. Do not sit on the toilet for a long time. Keep all follow-up visits as told by your doctor. This is important. Contact a doctor if you: Have pain and   swelling that do not get better with treatment or medicine. Have trouble pooping. Cannot poop. Have pain or swelling outside the area of the hemorrhoids. Get help right away if you have: Bleeding that will not stop. Summary Hemorrhoids are swollen veins in the butt or around the opening of the butt. They can cause pain, itching, or bleeding. Eat foods that have a lot of fiber in them. These include  whole grains, beans, nuts, fruits, and vegetables. Take a warm-water bath (sitz bath) for 20 minutes to ease pain. Do this 3-4 times a day. This information is not intended to replace advice given to you by your health care provider. Make sure you discuss any questions you have with your health care provider. Document Revised: 10/04/2020 Document Reviewed: 10/04/2020 Elsevier Patient Education  2023 Elsevier Inc.  

## 2021-08-14 NOTE — Progress Notes (Signed)
Patient follow-ups today for banding of hemorrhoids ? ? ? ?Summary of history : ?Initially referred and seen on 06/05/2021. Colonoscopy on 06/07/2021 was found to have polyps and internal hemorrhoids.  ? ? ? ?Interval history  06/05/2021-08/14/2021 ?Since the colonoscopy she states that she has had no rectal bleeding.  She is doing very well.  No new complaints. ? ?Plan: ? ?Avoid constipation.  Discussed about conservative management of internal hemorrhoids patient information provided advised clearly that if the issue recurs come back to see me and we can perform banding of hemorrhoids at the office.  We have discussed the process of hemorrhoidal banding at the office and she agrees that she would like to wait at this point of time. ? ?Follow-up: As needed ?Dr Jonathon Bellows MD,MRCP [facility]) ?Gastroenterology/Hepatology ?Pager: [phone] ?  ?

## 2022-02-17 IMAGING — MG MM DIGITAL SCREENING BILAT W/ TOMO AND CAD
8 series · 8 of 24 positions shown · non-contrast
Comparison: Previous exam(s).

CLINICAL DATA: Screening.

EXAM:
DIGITAL SCREENING BILATERAL MAMMOGRAM WITH TOMOSYNTHESIS AND CAD
TECHNIQUE: Bilateral screening digital craniocaudal and mediolateral oblique
mammograms were obtained. Bilateral screening digital breast
tomosynthesis was performed. The images were evaluated with
computer-aided detection.

[L MLO synth-2D]
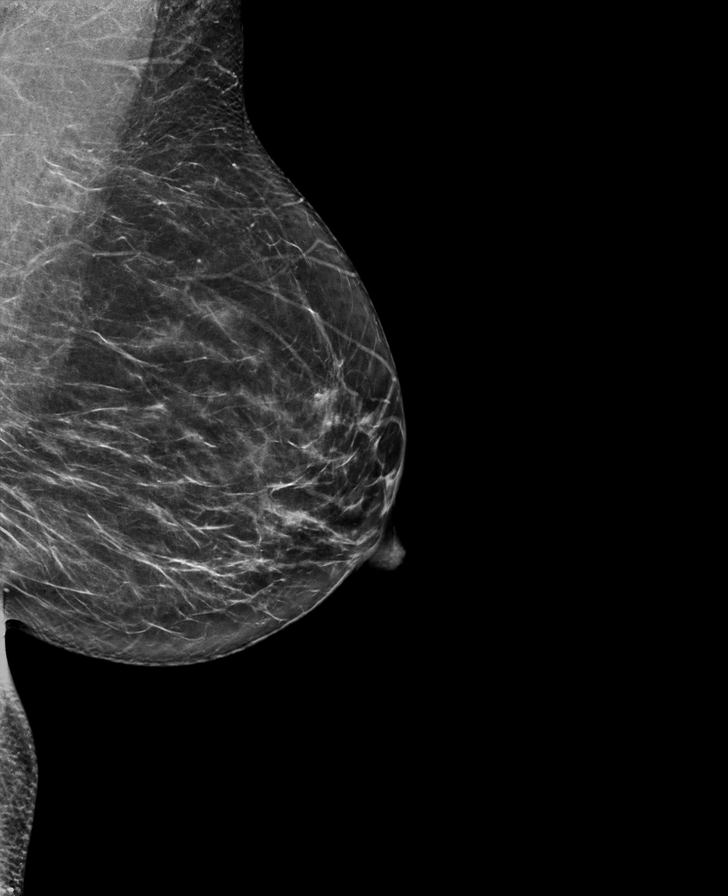

[R CC synth-2D]
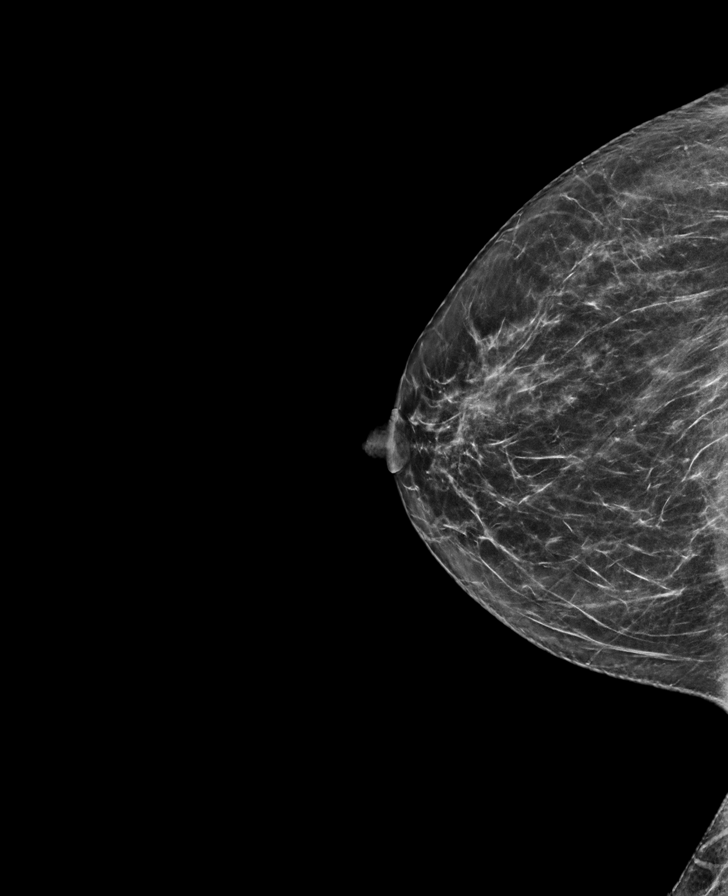

[R MLO synth-2D]
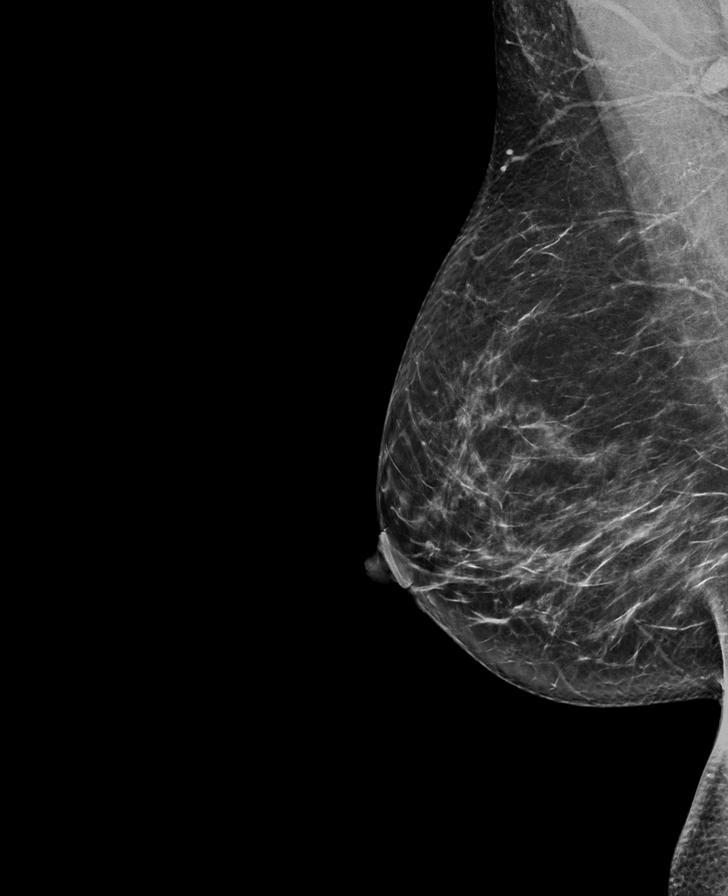

[L CC synth-2D]
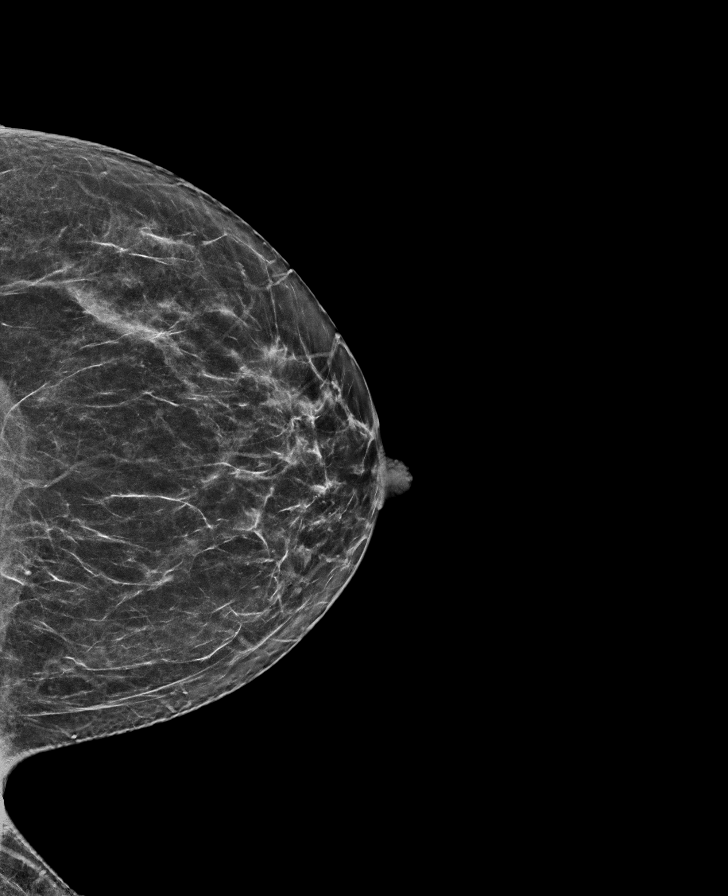

[R CC tomo · tomo slice 33/65.0]
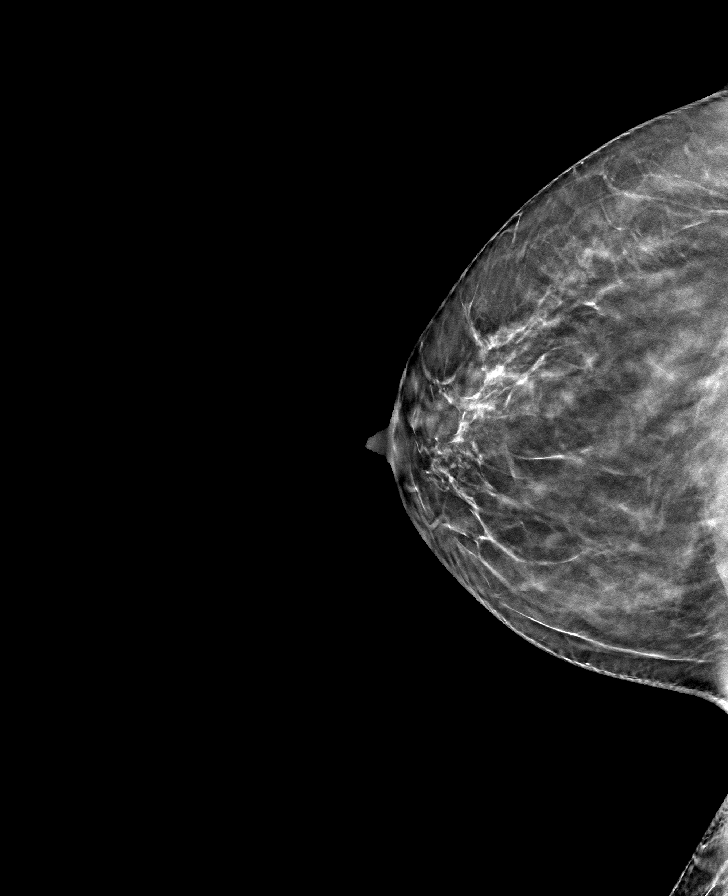

[L CC tomo · tomo slice 33/64.0]
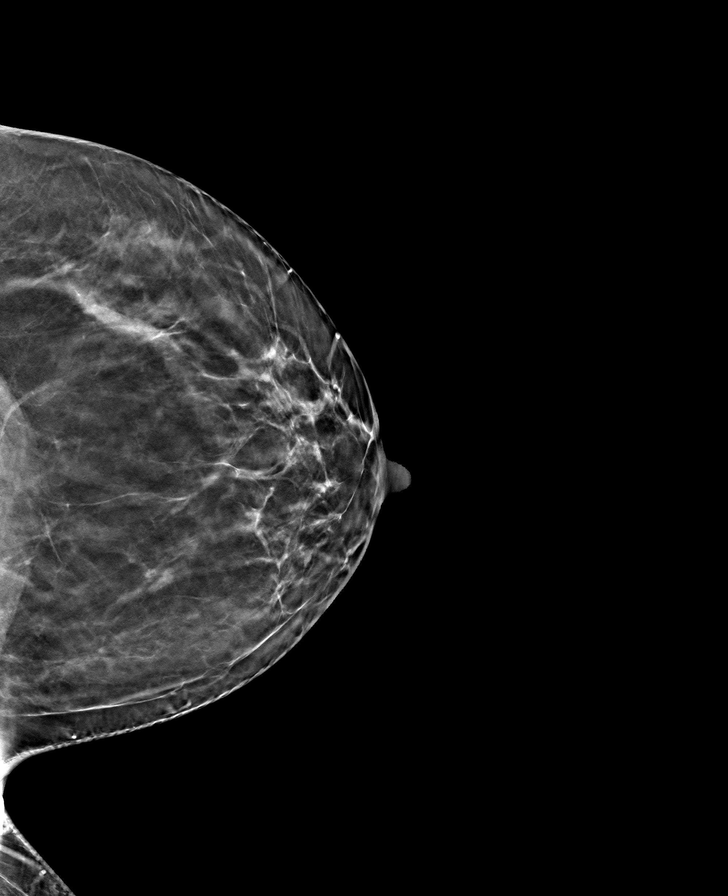

[L MLO tomo · tomo slice 36/71.0]
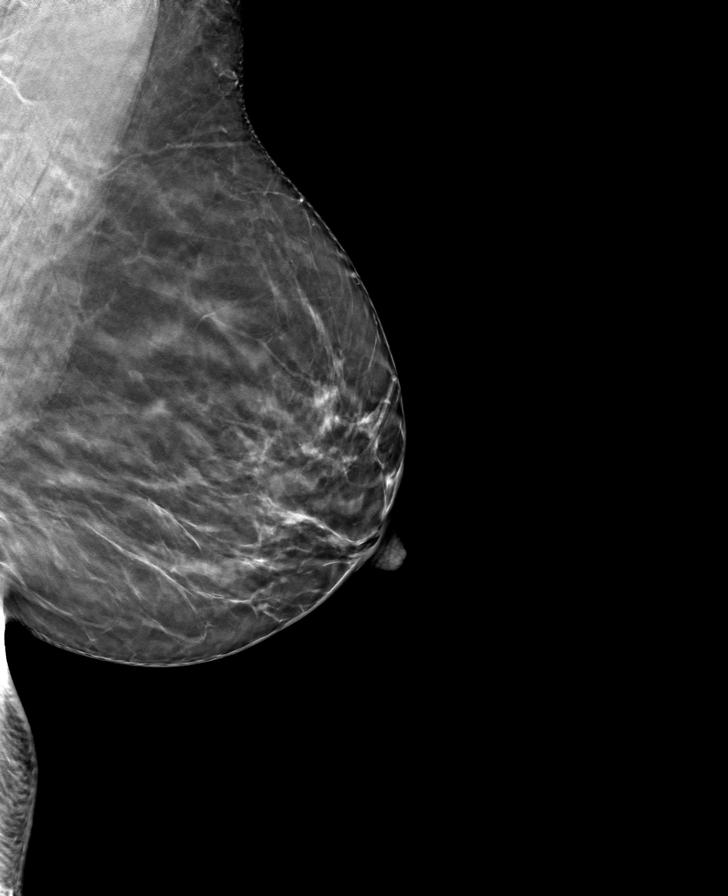

[R MLO tomo · tomo slice 37/73.0]
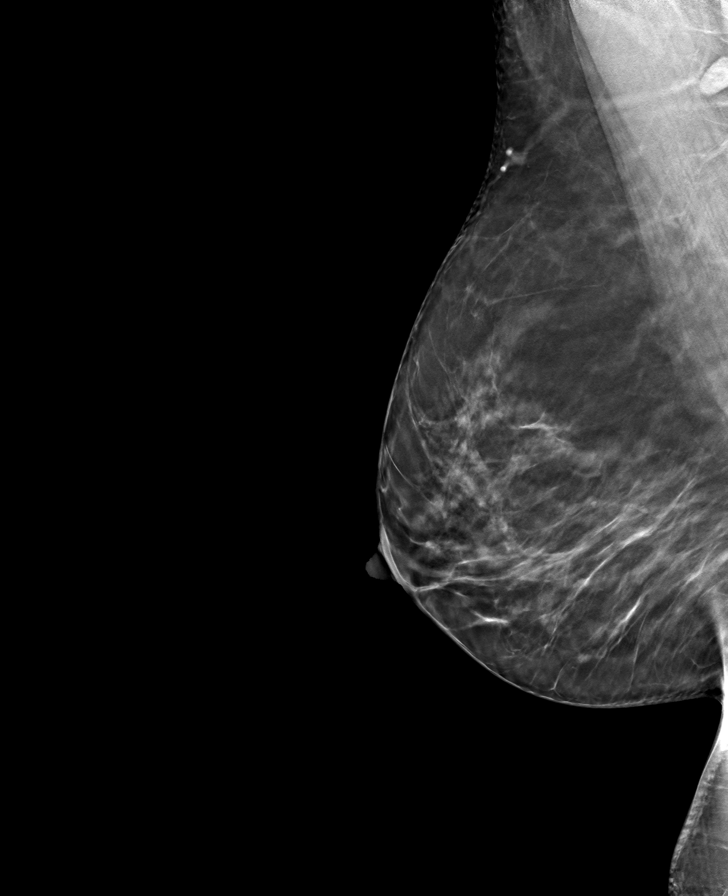

[8 of 24 positions shown; findings below may reference images not displayed]

ACR Breast Density Category b: There are scattered areas of
fibroglandular density.
FINDINGS: There are no findings suspicious for malignancy.
IMPRESSION: No mammographic evidence of malignancy. A result letter of this
screening mammogram will be mailed directly to the patient.

RECOMMENDATION:
Screening mammogram in one year. (Code:51-O-LD2)

BI-RADS CATEGORY  1: Negative.

## 2022-06-03 ENCOUNTER — Encounter: Payer: Self-pay | Admitting: Family Medicine

## 2022-06-03 ENCOUNTER — Ambulatory Visit: Payer: BC Managed Care – PPO | Admitting: Family Medicine

## 2022-06-03 VITALS — BP 108/74 | HR 97 | Temp 97.4°F | Resp 16 | Ht 63.0 in | Wt 172.8 lb

## 2022-06-03 DIAGNOSIS — M79645 Pain in left finger(s): Secondary | ICD-10-CM

## 2022-06-03 DIAGNOSIS — Z1231 Encounter for screening mammogram for malignant neoplasm of breast: Secondary | ICD-10-CM | POA: Diagnosis not present

## 2022-06-03 DIAGNOSIS — Z23 Encounter for immunization: Secondary | ICD-10-CM | POA: Diagnosis not present

## 2022-06-03 DIAGNOSIS — E782 Mixed hyperlipidemia: Secondary | ICD-10-CM

## 2022-06-03 DIAGNOSIS — M25532 Pain in left wrist: Secondary | ICD-10-CM

## 2022-06-03 DIAGNOSIS — R0683 Snoring: Secondary | ICD-10-CM

## 2022-06-03 DIAGNOSIS — J343 Hypertrophy of nasal turbinates: Secondary | ICD-10-CM

## 2022-06-03 MED ORDER — TRIAMCINOLONE ACETONIDE 55 MCG/ACT NA AERO: 2.0000 | INHALATION_SPRAY | Freq: Every day | NASAL | 12 refills | Status: DC

## 2022-06-03 MED ORDER — MELOXICAM 15 MG PO TABS: 7.5000 mg | ORAL_TABLET | Freq: Every day | ORAL | 0 refills | Status: DC

## 2022-06-03 NOTE — Progress Notes (Signed)
Patient ID: Sarah Carrillo, female    DOB: 18-May-1975, 47 y.o.   MRN: [id number]  PCP: Delsa Grana, PA-C  Chief Complaint  Patient presents with   Follow-up   Hyperlipidemia   Pain    Left wrist/hand    Subjective:   Sarah Carrillo is a 47 y.o. female, presents to clinic with CC of the following:   HLD- she states she is not working on diet/lifestyle efforts, she's gained weight recently- but does want to recheck labs  Lab Results  Component Value Date   CHOL 239 (H) 04/16/2021   HDL 49 (L) 04/16/2021   LDLCALC 160 (H) 04/16/2021   TRIG 151 (H) 04/16/2021   CHOLHDL 4.9 04/16/2021   Left wrist hand pain -onset at least 2-3 years ago, worsening particularly along her thumb and into her wrist, no improvement with trying NSAIDs and using a thumb spica, she has decreased grip strength cannot open jars, has decreased range of motion.  She is right-hand dominant, she types and cooks but otherwise denies any repetitive strenuous activity with her left hand or wrist.  She has not had similar symptoms in her right hand and wrist and no other joint pains or inflammation No prior injury or x-rays Symptoms are worse in the morning  Also snoring- She does feel well rested in the morning, is on antihistamine but is not doing nasal sprays   Patient Active Problem List   Diagnosis Date Noted   Mixed hyperlipidemia 01/26/2020   Leiomyoma 06/16/2019   Right hip pain 05/01/2018   Chronic bilateral low back pain with bilateral sciatica 05/01/2018   Overweight (BMI 25.0-29.9) 05/01/2018   Neck pain 05/01/2018   Trapezius strain 05/01/2018   Dyshidrotic eczema 05/01/2018   B12 deficiency    Vitamin D deficiency 12/25/2016      Current Outpatient Medications:    cetirizine (ZYRTEC) 10 MG tablet, Take 10 mg by mouth daily., Disp: , Rfl:    cholecalciferol (VITAMIN D3) 25 MCG (1000 UT) tablet, Take 1,000 Units by mouth daily., Disp: , Rfl:    Probiotic Product (PROBIOTIC-10 PO), Take  by mouth., Disp: , Rfl:    triamcinolone cream (KENALOG) 0.1 %, Apply 1 application. topically 2 (two) times daily as needed (eczema rash areas)., Disp: 45 g, Rfl: 1   Allergies  Allergen Reactions   Penicillin G Other (See Comments)   Penicillins Rash   Sulfa Antibiotics Rash     Social History   Tobacco Use   Smoking status: Never   Smokeless tobacco: Never  Vaping Use   Vaping Use: Never used  Substance Use Topics   Alcohol use: Yes    Comment: occasional   Drug use: No      Chart Review Today: I personally reviewed active problem list, medication list, allergies, family history, social history, health maintenance, notes from last encounter, lab results, imaging with the patient/caregiver today.   Review of Systems  Constitutional: Negative.   HENT: Negative.    Eyes: Negative.   Respiratory: Negative.    Cardiovascular: Negative.   Gastrointestinal: Negative.   Endocrine: Negative.   Genitourinary: Negative.   Musculoskeletal: Negative.   Skin: Negative.   Allergic/Immunologic: Negative.   Neurological: Negative.   Hematological: Negative.   Psychiatric/Behavioral: Negative.    All other systems reviewed and are negative.      Objective:   Vitals:   06/03/22 1109  BP: 108/74  Pulse: 97  Resp: 16  Temp: (!) 97.4 F (36.3  C)  TempSrc: Oral  SpO2: 99%  Weight: 172 lb 12.8 oz (78.4 kg)  Height: '5\' 3"'$  (1.6 m)    Body mass index is 30.61 kg/m.  Physical Exam Vitals and nursing note reviewed.  Constitutional:      General: She is not in acute distress.    Appearance: Normal appearance. She is well-developed. She is not ill-appearing, toxic-appearing or diaphoretic.  HENT:     Head: Normocephalic and atraumatic.     Right Ear: External ear normal.     Left Ear: External ear normal.     Nose: Mucosal edema present.     Right Turbinates: Enlarged and pale.     Left Turbinates: Enlarged and pale.  Eyes:     General:        Right eye: No  discharge.        Left eye: No discharge.     Conjunctiva/sclera: Conjunctivae normal.  Neck:     Trachea: No tracheal deviation.  Cardiovascular:     Rate and Rhythm: Normal rate and regular rhythm.     Pulses: Normal pulses.     Heart sounds: Normal heart sounds.  Pulmonary:     Effort: Pulmonary effort is normal. No respiratory distress.     Breath sounds: Normal breath sounds. No stridor.  Musculoskeletal:     Left wrist: No swelling, deformity or crepitus. Decreased range of motion.     Left hand: Decreased range of motion. Decreased strength. Normal sensation. There is no disruption of two-point discrimination. Normal capillary refill.  Skin:    General: Skin is warm and dry.     Findings: No rash.  Neurological:     Mental Status: She is alert.     Motor: No abnormal muscle tone.     Coordination: Coordination normal.  Psychiatric:        Behavior: Behavior normal.      Results for orders placed or performed during the hospital encounter of 06/07/21  Pregnancy, urine POC  Result Value Ref Range   Preg Test, Ur NEGATIVE NEGATIVE  Surgical pathology  Result Value Ref Range   SURGICAL PATHOLOGY      SURGICAL PATHOLOGY CASE: [phone] PATIENT: Vennie Homans Surgical Pathology Report     Specimen Submitted: A. Colon polyp, descending; cold snare B. Colon polyp, sigmoid; cold snare  Clinical History: Rectal bleeding K62.5.  Colon polyps    DIAGNOSIS: A. COLON POLYP, DESCENDING; COLD SNARE: - SESSILE SERRATED POLYP. - NEGATIVE FOR DYSPLASIA AND MALIGNANCY.  B. COLON POLYP, SIGMOID; COLD SNARE: - HYPERPLASTIC POLYP. - NEGATIVE FOR DYSPLASIA AND MALIGNANCY.  GROSS DESCRIPTION: A. Labeled: Cold snared descending colon polyp Received: Formalin Collection time: 11:45 AM on 06/07/2021 Placed into formalin time: 11:45 AM on 06/07/2021 Tissue fragment(s): Multiple Size: Aggregate, 1.0 x 0.4 x 0.1 cm Description: Received is a tan soft tissue fragment, admixed  with intestinal debris.  The ratio of soft tissue to intestinal debris is 70: 30. Entirely submitted in 1 cassette.  B. Labeled: Cold snared sigmoid colon polyp Received: Formalin Collection time: 12 :20 PM on 06/07/2021 Placed into formalin time: 12:20 PM on 06/07/2021 Tissue fragment(s): 1 Size: 0.5 x 0.5 x 0.1 cm Description: Tan soft tissue fragment Entirely submitted in 1 cassette.  CM 06/07/2021  Final Diagnosis performed by Allena Napoleon, MD.   Electronically signed 06/08/2021 10:20:45AM The electronic signature indicates that the named Attending Pathologist has evaluated the specimen Technical component performed at [facility], [address] [place], [place] [address] Lab: [phone]  Dir: Rush Farmer, MD, MMM  Professional component performed at [facility], [facility], [place], [place], [place] [address] Lab: [phone] Dir: Kathi Simpers, MD        Assessment & Plan:   1. Mixed hyperlipidemia Not currently on medications or working on diet or lifestyle efforts she states because she is concerned about and would like to recheck labs today Likely has some familial hyperlipidemia discussed indications for statin Reviewed her last lipid panel Lab Results  Component Value Date   CHOL 239 (H) 04/16/2021   HDL 49 (L) 04/16/2021   LDLCALC 160 (H) 04/16/2021   TRIG 151 (H) 04/16/2021   CHOLHDL 4.9 04/16/2021    - COMPLETE METABOLIC PANEL WITH GFR - Lipid panel  2. Encounter for screening mammogram for malignant neoplasm of breast  - MM 3D SCREEN BREAST BILATERAL; Future  3. Need for influenza vaccination  - Flu Vaccine QUAD 21moIM (Fluarix, Fluzone & Alfiuria Quad PF)  4. Thumb pain, left Pain along left thumb extending into wrist onset 2 to 3 years ago no improvement with thumb spica NSAIDs and rest No prior injury Refer to hand specialist she can try Mobic and continue bracing and rest in the meantime - Ambulatory referral to  Orthopedics - meloxicam (MOBIC) 15 MG tablet; Take 0.5-1 tablets (7.5-15 mg total) by mouth daily.  Dispense: 30 tablet; Refill: 0  5. Left wrist pain Same as above no numbness or tingling, decreased range of motion of wrist and patient was unable to do phalen's and tinel's was negative - Ambulatory referral to Orthopedics - meloxicam (MOBIC) 15 MG tablet; Take 0.5-1 tablets (7.5-15 mg total) by mouth daily.  Dispense: 30 tablet; Refill: 0  6. Nasal turbinate hypertrophy 7. Snoring Boggy pale and enlarged nasal turbinates bilaterally without obstruction or recurrent sinusitis she is concerned about snoring I have encouraged her to try intranasal steroid spray in addition to her daily antihistamine    LDelsa Grana PA-C 06/03/22 11:36 AM

## 2022-06-03 NOTE — Patient Instructions (Signed)
[facility] at [facility] [address], [place], [place] [address] Scheduling phone #: [phone]

## 2022-06-04 LAB — COMPLETE METABOLIC PANEL WITH GFR
AG Ratio: 1.4 (calc) (ref 1.0–2.5)
ALT: 12 U/L (ref 6–29)
AST: 13 U/L (ref 10–35)
Albumin: 4.1 g/dL (ref 3.6–5.1)
Alkaline phosphatase (APISO): 66 U/L (ref 31–125)
BUN: 14 mg/dL (ref 7–25)
CO2: 27 mmol/L (ref 20–32)
Calcium: 9.6 mg/dL (ref 8.6–10.2)
Chloride: 103 mmol/L (ref 98–110)
Creat: 0.66 mg/dL (ref 0.50–0.99)
Globulin: 3 g/dL (calc) (ref 1.9–3.7)
Glucose, Bld: 91 mg/dL (ref 65–99)
Potassium: 5.2 mmol/L (ref 3.5–5.3)
Sodium: 140 mmol/L (ref 135–146)
Total Bilirubin: 1 mg/dL (ref 0.2–1.2)
Total Protein: 7.1 g/dL (ref 6.1–8.1)
eGFR: 109 mL/min/{1.73_m2} (ref >=60)

## 2022-06-04 LAB — LIPID PANEL
Cholesterol: 249 mg/dL — ABNORMAL HIGH (ref <200)
HDL: 41 mg/dL — ABNORMAL LOW (ref >=50)
LDL Cholesterol (Calc): 161 mg/dL (calc) — ABNORMAL HIGH
Non-HDL Cholesterol (Calc): 208 mg/dL (calc) — ABNORMAL HIGH (ref <130)
Total CHOL/HDL Ratio: 6.1 (calc) — ABNORMAL HIGH (ref <5.0)
Triglycerides: 303 mg/dL — ABNORMAL HIGH (ref <150)

## 2022-06-06 DIAGNOSIS — G5602 Carpal tunnel syndrome, left upper limb: Secondary | ICD-10-CM | POA: Diagnosis not present

## 2022-06-26 DIAGNOSIS — G5602 Carpal tunnel syndrome, left upper limb: Secondary | ICD-10-CM | POA: Diagnosis not present

## 2022-09-05 ENCOUNTER — Ambulatory Visit
Admission: RE | Admit: 2022-09-05 | Discharge: 2022-09-05 | Disposition: A | Discharge status: Home / Self Care | Payer: BC Managed Care – PPO | Source: Ambulatory Visit | Attending: Family Medicine | Admitting: Family Medicine

## 2022-09-05 DIAGNOSIS — Z1231 Encounter for screening mammogram for malignant neoplasm of breast: Secondary | ICD-10-CM | POA: Insufficient documentation

## 2023-12-11 ENCOUNTER — Other Ambulatory Visit: Payer: Self-pay | Admitting: Family Medicine

## 2023-12-11 DIAGNOSIS — Z1231 Encounter for screening mammogram for malignant neoplasm of breast: Secondary | ICD-10-CM

## 2024-01-11 NOTE — Progress Notes (Unsigned)
 PCP:  Leavy Mole, PA-C   No chief complaint on file.    HPI:      [name] is a 48 y.o. No obstetric history on file. who LMP was No LMP recorded., presents today for her annual examination.  Her menses are regular every 28-30 days, lasting 4-5 days, med flow.  Dysmenorrhea mild, occurring first 1-2 days of flow. She does not have intermenstrual bleeding. She had issues with menorrhagia in past but menses are better now.   Pt with hx of RLQ pain that is achy, sharp, stabbing; radiates to low back and down front of RT leg. Sx only 2-3 times this yr, mostly with ovulation, improved with ibup 600 mg. Hx of 5 large leio on 3/21 GYN u/s, largest is 7 cm.  Sex activity: single partner, contraception - condoms. Declines other BC. No dyspareunia. Last Pap: 04/10/21 Results were: no abnormalities /neg HPV DNA  Hx of STDs: none  Has had issues with abscess/ingrown hair RT buttocks area past few months. Sx flare with menses and drain, then recur with next menses. Has left scar. No similar sx elsewhere.   Last mammogram: 09/05/22 Results were normal, repeat in 12 months; has appt today There is no FH of breast cancer. There is no FH of ovarian cancer. The patient does do self-breast exams.  Tobacco use: The patient denies current or previous tobacco use. Alcohol use: none No drug use.  Exercise: moderately active  Colonoscopy: 3/23 with [facility] with polyps, repeat due after 5 yrs.   She does get adequate calcium and Vitamin D  in her diet. Hx of Vit D deficiency  Labs with PCP.  Past Medical History:  Diagnosis Date   B12 deficiency    Menorrhagia    Vitamin D  deficiency     Past Surgical History:  Procedure Laterality Date   COLONOSCOPY WITH PROPOFOL  N/A 06/07/2021   Procedure: COLONOSCOPY WITH PROPOFOL ;  Surgeon: Therisa Bi, MD;  Location: [facility] ENDOSCOPY;  Service: Gastroenterology;  Laterality: N/A;   NO PAST SURGERIES      Family History  Problem Relation Age of  Onset   Diabetes Mother    Rheum arthritis Mother    Hypertension Mother    Hyperlipidemia Mother    Diabetes Father    Hyperlipidemia Sister    Hyperlipidemia Sister    Breast cancer Neg Hx     Social History   Socioeconomic History   Marital status: Married    Spouse name: Insurance risk surveyor   Number of children: 2   Years of education: Not on file   Highest education level: Not on file  Occupational History   Not on file  Tobacco Use   Smoking status: Never   Smokeless tobacco: Never  Vaping Use   Vaping status: Never Used  Substance and Sexual Activity   Alcohol use: Yes    Comment: occasional   Drug use: No   Sexual activity: Yes    Partners: Male    Birth control/protection: None  Other Topics Concern   Not on file  Social History Narrative   Married - Husband's name is [place]; Son is Chase (14yo), Daughter Jade (20) in college.    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   Social Drivers of Health   Financial Resource Strain: Low Risk  (03/09/2020)   Overall Financial Resource Strain (CARDIA)    Difficulty of Paying Living Expenses: Not hard at all  Food Insecurity: No Food Insecurity (03/09/2020)  Hunger Vital Sign    Worried About Running Out of Food in the Last Year: Never true    Ran Out of Food in the Last Year: Never true  Transportation Needs: No Transportation Needs (03/09/2020)   PRAPARE - Administrator, Civil Service (Medical): No    Lack of Transportation (Non-Medical): No  Physical Activity: Insufficiently Active (03/09/2020)   Exercise Vital Sign    Days of Exercise per Week: 2 days    Minutes of Exercise per Session: 30 min  Stress: No Stress Concern Present (03/09/2020)   [employer] of Occupational Health - Occupational Stress Questionnaire    Feeling of Stress : Not at all  Social Connections: Moderately Isolated (03/09/2020)   Social Connection and Isolation Panel    Frequency of Communication with Friends and Family: More  than three times a week    Frequency of Social Gatherings with Friends and Family: More than three times a week    Attends Religious Services: Never    Database administrator or Organizations: No    Attends Banker Meetings: Never    Marital Status: Married  Catering manager Violence: Not At Risk (03/09/2020)   Humiliation, Afraid, Rape, and Kick questionnaire    Fear of Current or Ex-Partner: No    Emotionally Abused: No    Physically Abused: No    Sexually Abused: No    No outpatient medications have been marked as taking for the 01/12/24 encounter (Appointment) with Atticus Wedin B, PA-C.     ROS:  Review of Systems  Constitutional:  Negative for fatigue, fever and unexpected weight change.  Respiratory:  Negative for cough, shortness of breath and wheezing.   Cardiovascular:  Negative for chest pain, palpitations and leg swelling.  Gastrointestinal:  Negative for blood in stool, constipation, diarrhea, nausea and vomiting.  Endocrine: Negative for cold intolerance, heat intolerance and polyuria.  Genitourinary:  Negative for dyspareunia, dysuria, flank pain, frequency, genital sores, hematuria, menstrual problem, pelvic pain, urgency, vaginal bleeding, vaginal discharge and vaginal pain.  Musculoskeletal:  Negative for back pain, joint swelling and myalgias.  Skin:  Positive for wound. Negative for rash.  Neurological:  Negative for dizziness, syncope, light-headedness, numbness and headaches.  Hematological:  Negative for adenopathy.  Psychiatric/Behavioral:  Negative for agitation, confusion, sleep disturbance and suicidal ideas. The patient is not nervous/anxious.      Objective: There were no vitals taken for this visit.   Physical Exam Constitutional:      General: She is not in acute distress.    Appearance: She is well-developed.  Genitourinary:     Vulva normal.     Right Labia: No rash, tenderness or lesions.    Left Labia: No tenderness,  lesions or rash.    No vaginal discharge, erythema, tenderness or bleeding.      Right Adnexa: not tender and no mass present.    Left Adnexa: not tender and no mass present.    No cervical motion tenderness, friability or polyp.     Uterus is enlarged.     Uterus is not tender.  Breasts:    Right: No mass, nipple discharge, skin change or tenderness.     Left: No mass, nipple discharge, skin change or tenderness.  Neck:     Thyroid: No thyromegaly.  Cardiovascular:     Rate and Rhythm: Normal rate and regular rhythm.     Heart sounds: Normal heart sounds. No murmur heard. Pulmonary:  Effort: Pulmonary effort is normal.     Breath sounds: Normal breath sounds.  Abdominal:     Palpations: Abdomen is soft.     Tenderness: There is no abdominal tenderness. There is no guarding or rebound.  Musculoskeletal:        General: Normal range of motion.     Cervical back: Normal range of motion.  Lymphadenopathy:     Cervical: No cervical adenopathy.  Neurological:     General: No focal deficit present.     Mental Status: She is alert and oriented to person, place, and time.     Cranial Nerves: No cranial nerve deficit.  Skin:    General: Skin is warm and dry.  Psychiatric:        Mood and Affect: Mood normal.        Behavior: Behavior normal.        Thought Content: Thought content normal.        Judgment: Judgment normal.  Vitals and nursing note reviewed.     Assessment/Plan: Encounter for annual routine gynecological examination  Cervical cancer screening - Plan: Cytology - PAP  Screening for HPV (human papillomavirus) - Plan: Cytology - PAP  Encounter for screening mammogram for malignant neoplasm of breast - Plan: MM 3D SCREEN BREAST BILATERAL; pt [facility]  Screening for colon cancer--discussed colonoscopy due to hx of rectal bleeding this yr. Pt to discuss with insurance co and f/u with me or PCP for GI ref.   Leiomyoma--stable, not affecting  menses.  Cutaneous abscess of other site--RT buttocks/vulvar area. Resolved currently, so hopefully won't reform next menses. If so, warm compresses and pt to call for abx Rx.  F/u prn.    GYN counsel mammography screening, adequate intake of calcium and vitamin D , diet and exercise     F/U  No follow-ups on file.  Marline Morace B. Blaike Newburn, PA-C 01/11/2024 9:35 AM

## 2024-01-12 ENCOUNTER — Ambulatory Visit (INDEPENDENT_AMBULATORY_CARE_PROVIDER_SITE_OTHER): Admitting: Obstetrics and Gynecology

## 2024-01-12 ENCOUNTER — Encounter: Payer: Self-pay | Admitting: Obstetrics and Gynecology

## 2024-01-12 ENCOUNTER — Ambulatory Visit
Admission: RE | Admit: 2024-01-12 | Discharge: 2024-01-12 | Disposition: A | Discharge status: Home / Self Care | Source: Ambulatory Visit | Attending: Family Medicine | Admitting: Family Medicine

## 2024-01-12 VITALS — BP 115/78 | HR 90 | Ht 62.5 in | Wt 173.0 lb

## 2024-01-12 DIAGNOSIS — E049 Nontoxic goiter, unspecified: Secondary | ICD-10-CM

## 2024-01-12 DIAGNOSIS — D259 Leiomyoma of uterus, unspecified: Secondary | ICD-10-CM | POA: Diagnosis not present

## 2024-01-12 DIAGNOSIS — Z01411 Encounter for gynecological examination (general) (routine) with abnormal findings: Secondary | ICD-10-CM

## 2024-01-12 DIAGNOSIS — Z1231 Encounter for screening mammogram for malignant neoplasm of breast: Secondary | ICD-10-CM | POA: Diagnosis not present

## 2024-01-12 DIAGNOSIS — Z01419 Encounter for gynecological examination (general) (routine) without abnormal findings: Secondary | ICD-10-CM

## 2024-01-12 DIAGNOSIS — R1032 Left lower quadrant pain: Secondary | ICD-10-CM

## 2024-01-12 DIAGNOSIS — D219 Benign neoplasm of connective and other soft tissue, unspecified: Secondary | ICD-10-CM

## 2024-01-12 NOTE — Patient Instructions (Signed)
 I value your feedback and you entrusting [place] with your care. If you get a [place] patient survey, I would appreciate you taking the time to let us know about your experience today. Thank you! ? ? ?

## 2024-01-16 ENCOUNTER — Ambulatory Visit: Payer: Self-pay | Admitting: Internal Medicine

## 2024-01-22 ENCOUNTER — Encounter: Payer: Self-pay | Admitting: Nurse Practitioner

## 2024-01-22 ENCOUNTER — Ambulatory Visit (INDEPENDENT_AMBULATORY_CARE_PROVIDER_SITE_OTHER): Admitting: Nurse Practitioner

## 2024-01-22 VITALS — BP 122/80 | HR 84 | Temp 98.1°F | Ht 62.0 in | Wt 178.0 lb

## 2024-01-22 DIAGNOSIS — R1030 Lower abdominal pain, unspecified: Secondary | ICD-10-CM

## 2024-01-22 DIAGNOSIS — L301 Dyshidrosis [pompholyx]: Secondary | ICD-10-CM | POA: Diagnosis not present

## 2024-01-22 DIAGNOSIS — Z131 Encounter for screening for diabetes mellitus: Secondary | ICD-10-CM | POA: Diagnosis not present

## 2024-01-22 DIAGNOSIS — G8929 Other chronic pain: Secondary | ICD-10-CM

## 2024-01-22 DIAGNOSIS — Z0000 Encounter for general adult medical examination without abnormal findings: Secondary | ICD-10-CM

## 2024-01-22 DIAGNOSIS — M5442 Lumbago with sciatica, left side: Secondary | ICD-10-CM | POA: Diagnosis not present

## 2024-01-22 DIAGNOSIS — R635 Abnormal weight gain: Secondary | ICD-10-CM

## 2024-01-22 DIAGNOSIS — Z23 Encounter for immunization: Secondary | ICD-10-CM

## 2024-01-22 DIAGNOSIS — E782 Mixed hyperlipidemia: Secondary | ICD-10-CM

## 2024-01-22 DIAGNOSIS — Z130 Encounter for screening for diseases of the blood and blood-forming organs and certain disorders involving the immune mechanism: Secondary | ICD-10-CM

## 2024-01-22 DIAGNOSIS — E66811 Obesity, class 1: Secondary | ICD-10-CM

## 2024-01-22 DIAGNOSIS — Z0001 Encounter for general adult medical examination with abnormal findings: Secondary | ICD-10-CM

## 2024-01-22 DIAGNOSIS — M5441 Lumbago with sciatica, right side: Secondary | ICD-10-CM

## 2024-01-22 DIAGNOSIS — Z1329 Encounter for screening for other suspected endocrine disorder: Secondary | ICD-10-CM

## 2024-01-22 DIAGNOSIS — Z6832 Body mass index (BMI) 32.0-32.9, adult: Secondary | ICD-10-CM

## 2024-01-22 DIAGNOSIS — R7989 Other specified abnormal findings of blood chemistry: Secondary | ICD-10-CM | POA: Diagnosis not present

## 2024-01-22 MED ORDER — TRIAMCINOLONE ACETONIDE 0.1 % EX CREA: 1.0000 | TOPICAL_CREAM | Freq: Two times a day (BID) | CUTANEOUS | 1 refills | Status: AC | PRN

## 2024-01-22 NOTE — Assessment & Plan Note (Signed)
 Referral placed to [facility].  Patient reports she is doing okay right now, but reports this has been a chronic problem and would like to see ortho.

## 2024-01-22 NOTE — Progress Notes (Deleted)
   LMP 12/24/2023    Subjective:    Patient ID: Sarah Carrillo, female    DOB: April 16, 1975, 48 y.o.   MRN: [id number]  HPI: Sarah Carrillo is a 48 y.o. female  No chief complaint on file.   Discussed the use of AI scribe software for clinical note transcription with the patient, who gave verbal consent to proceed.           06/03/2022   11:08 AM 07/23/2021    9:25 AM 04/16/2021   11:17 AM  Depression screen PHQ 2/9  Decreased Interest 0 0 0  Down, Depressed, Hopeless 0 0 0  PHQ - 2 Score 0 0 0  Altered sleeping 0 0   Tired, decreased energy 0 0   Change in appetite 0 0   Feeling bad or failure about yourself  0 0   Trouble concentrating 0 0   Moving slowly or fidgety/restless 0 0   Suicidal thoughts 0 0   PHQ-9 Score 0 0   Difficult doing work/chores Not difficult at all Not difficult at all     Relevant past medical, surgical, family and social history reviewed and updated as indicated. Interim medical history since our last visit reviewed. Allergies and medications reviewed and updated.  Review of Systems  Per HPI unless specifically indicated above     Objective:     LMP 12/24/2023   {Vitals History (Optional):23777} Wt Readings from Last 3 Encounters:  01/12/24 173 lb (78.5 kg)  06/03/22 172 lb 12.8 oz (78.4 kg)  08/14/21 163 lb 3.2 oz (74 kg)    Physical Exam   Results for orders placed or performed in visit on 06/03/22  COMPLETE METABOLIC PANEL WITH GFR   Collection Time: 06/03/22 11:50 AM  Result Value Ref Range   Glucose, Bld 91 65 - 99 mg/dL   BUN 14 7 - 25 mg/dL   Creat 9.33 9.49 - 9.00 mg/dL   eGFR 890 > OR = 60 fO/fpw/8.26f7   BUN/Creatinine Ratio SEE NOTE: 6 - 22 (calc)   Sodium 140 135 - 146 mmol/L   Potassium 5.2 3.5 - 5.3 mmol/L   Chloride 103 98 - 110 mmol/L   CO2 27 20 - 32 mmol/L   Calcium 9.6 8.6 - 10.2 mg/dL   Total Protein 7.1 6.1 - 8.1 g/dL   Albumin 4.1 3.6 - 5.1 g/dL   Globulin 3.0 1.9 - 3.7 g/dL (calc)   AG Ratio 1.4 1.0 -  2.5 (calc)   Total Bilirubin 1.0 0.2 - 1.2 mg/dL   Alkaline phosphatase (APISO) 66 31 - 125 U/L   AST 13 10 - 35 U/L   ALT 12 6 - 29 U/L  Lipid panel   Collection Time: 06/03/22 11:50 AM  Result Value Ref Range   Cholesterol 249 (H) <200 mg/dL   HDL 41 (L) > OR = 50 mg/dL   Triglycerides 696 (H) <150 mg/dL   LDL Cholesterol (Calc) 161 (H) mg/dL (calc)   Total CHOL/HDL Ratio 6.1 (H) <5.0 (calc)   Non-HDL Cholesterol (Calc) 208 (H) <130 mg/dL (calc)   {Labs ([place]):76220}       Assessment & Plan:   Problem List Items Addressed This Visit   None    Assessment and Plan         Follow up plan: No follow-ups on file.

## 2024-01-22 NOTE — Progress Notes (Signed)
 Name: Sarah Carrillo   MRN: [id number]    DOB: 18-Apr-1975   Date:01/22/2024       Progress Note  Subjective  Chief Complaint  For annual wellness exam as well as peri-menopausal symptoms, sciatica pain, and lower abdominal pain.  HPI  Patient presents for annual CPE  as well as concerns for peri-menopausal symptoms such as weight gain and fatigue, sciatica pain, and lower abdominal pain. She is interested in seeing a nutritionist to help with weight management. She reports having dealt with sciatica for many years but has gotten worse as she has gotten older. She takes ibuprofen for pain relief, which is able to provide some relief. She endorses that the pain is better when incorporating stretching exercises, so she is working on that. She has been dealing with lower abdominal pain that comes and goes for about 1.5 years now. She is followed by GYN for fibroids and is due to have an ultrasound on 02/04/24 to check to see if fibroids are growing in size. She has had enlarged fibroids in the past and possibly a ruptured ovarian cyst which caused her similar abdominal pain to what she has been having recently. She is not having any nausea, vomiting, diarrhea, or fever at this time. No urinary symptoms or flank pain. She also reports needing a refill of triamcinolone  cream for eczema.    HPI  Diet: Eats 3 well balanced meals a day with vegetables, a carb, and protein  Exercise:  walking and yoga 2 times a week  Sleep: 7-8 hrs/night  Last dental exam:09/2023 Last eye exam: 12/2023  Flowsheet Row Office Visit from 01/22/2024 in [facility]  AUDIT-C Score 1   Depression: Phq 9 is  negative    01/22/2024    2:32 PM 06/03/2022   11:08 AM 07/23/2021    9:25 AM 04/16/2021   11:17 AM 03/09/2020   10:17 AM  Depression screen PHQ 2/9  Decreased Interest 0 0 0 0 0  Down, Depressed, Hopeless 0 0 0 0 0  PHQ - 2 Score 0 0 0 0 0  Altered sleeping 0 0 0    Tired, decreased energy  0 0 0    Change in appetite 0 0 0    Feeling bad or failure about yourself  0 0 0    Trouble concentrating 0 0 0    Moving slowly or fidgety/restless 0 0 0    Suicidal thoughts 0 0 0    PHQ-9 Score 0 0 0    Difficult doing work/chores Not difficult at all Not difficult at all Not difficult at all     Hypertension: BP Readings from Last 3 Encounters:  01/22/24 122/80  01/12/24 115/78  06/03/22 108/74   Obesity: Wt Readings from Last 3 Encounters:  01/22/24 178 lb (80.7 kg)  01/12/24 173 lb (78.5 kg)  06/03/22 172 lb 12.8 oz (78.4 kg)   BMI Readings from Last 3 Encounters:  01/22/24 32.56 kg/m  01/12/24 31.14 kg/m  06/03/22 30.61 kg/m     Vaccines:  HPV: up to at age 49 , ask insurance if age between 20-45  Shingrix: 4-64 yo and ask insurance if covered when patient above 61 yo Pneumonia:  educated and discussed with patient. Flu: Received today   Hep C Screening: 05/01/2018 STD testing and prevention (HIV/chl/gon/syphilis): 05/01/2018 Intimate partner violence:no  Sexual History : sexually active with one partner  Menstrual History/LMP/Abnormal Bleeding: started today, having regular periods Incontinence Symptoms: no  Breast cancer:  - Last Mammogram: 01/16/2024 - BRCA gene screening: none  Osteoporosis: Discussed high calcium and vitamin D  supplementation, weight bearing exercises  Cervical cancer screening: 04/10/2021  Skin cancer: Discussed monitoring for atypical lesions  Colorectal cancer: 06/07/2021   Lung cancer:  Does not qualify. Low Dose CT Chest recommended if Age 73-80 years, 20 pack-year currently smoking OR have quit w/in 15years. Patient does not qualify.   ECG: 01/10/2013  Advanced Care Planning: A voluntary discussion about advance care planning including the explanation and discussion of advance directives.  Discussed health care proxy and Living will, and the patient was able to identify a health care proxy as husband .  Patient does not have a  living will at present time. If patient does have living will, I have requested they bring this to the clinic to be scanned in to their chart.  Lipids: Lab Results  Component Value Date   CHOL 249 (H) 06/03/2022   CHOL 239 (H) 04/16/2021   CHOL 271 (H) 03/09/2020   Lab Results  Component Value Date   HDL 41 (L) 06/03/2022   HDL 49 (L) 04/16/2021   HDL 50 03/09/2020   Lab Results  Component Value Date   LDLCALC 161 (H) 06/03/2022   LDLCALC 160 (H) 04/16/2021   LDLCALC 186 (H) 03/09/2020   Lab Results  Component Value Date   TRIG 303 (H) 06/03/2022   TRIG 151 (H) 04/16/2021   TRIG 181 (H) 03/09/2020   Lab Results  Component Value Date   CHOLHDL 6.1 (H) 06/03/2022   CHOLHDL 4.9 04/16/2021   CHOLHDL 5.4 (H) 03/09/2020   No results found for: LDLDIRECT  Glucose: Glucose  Date Value Ref Range Status  01/09/2013 156 (H) 65 - 99 mg/dL Final   Glucose, Bld  Date Value Ref Range Status  06/03/2022 91 65 - 99 mg/dL Final    Comment:    .            Fasting reference interval .   04/16/2021 94 65 - 99 mg/dL Final    Comment:    .            Fasting reference interval .   03/09/2020 90 65 - 99 mg/dL Final    Comment:    .            Fasting reference interval .     Patient Active Problem List   Diagnosis Date Noted   Class 1 obesity due to excess calories with serious comorbidity and body mass index (BMI) of 32.0 to 32.9 in adult 01/22/2024   Mixed hyperlipidemia 01/26/2020   Leiomyoma 06/16/2019   Right hip pain 05/01/2018   Chronic bilateral low back pain with bilateral sciatica 05/01/2018   Overweight (BMI 25.0-29.9) 05/01/2018   Neck pain 05/01/2018   Trapezius strain 05/01/2018   Dyshidrotic eczema 05/01/2018   B12 deficiency    Vitamin D  deficiency 12/25/2016    Past Surgical History:  Procedure Laterality Date   COLONOSCOPY WITH PROPOFOL  N/A 06/07/2021   Procedure: COLONOSCOPY WITH PROPOFOL ;  Surgeon: Therisa Bi, MD;  Location: [facility]  ENDOSCOPY;  Service: Gastroenterology;  Laterality: N/A;    Family History  Problem Relation Age of Onset   Diabetes Mother    Rheum arthritis Mother    Hypertension Mother    Hyperlipidemia Mother    Diabetes Father    Hyperlipidemia Sister    Hyperlipidemia Sister    Breast cancer Neg Hx     Social  History   Socioeconomic History   Marital status: Married    Spouse name: Stacy   Number of children: 2   Years of education: Not on file   Highest education level: Not on file  Occupational History   Not on file  Tobacco Use   Smoking status: Never   Smokeless tobacco: Never  Vaping Use   Vaping status: Never Used  Substance and Sexual Activity   Alcohol use: Yes    Comment: occasional   Drug use: No   Sexual activity: Yes    Partners: Male    Birth control/protection: None  Other Topics Concern   Not on file  Social History Narrative   Married - Husband's name is Sports administrator; Son is Chase (14yo), Daughter Jade (20) in college.    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   Social Drivers of Corporate investment banker Strain: Low Risk  (01/22/2024)   Overall Financial Resource Strain (CARDIA)    Difficulty of Paying Living Expenses: Not hard at all  Food Insecurity: No Food Insecurity (01/22/2024)   Hunger Vital Sign    Worried About Running Out of Food in the Last Year: Never true    Ran Out of Food in the Last Year: Never true  Transportation Needs: No Transportation Needs (01/22/2024)   PRAPARE - Administrator, Civil Service (Medical): No    Lack of Transportation (Non-Medical): No  Physical Activity: Insufficiently Active (01/22/2024)   Exercise Vital Sign    Days of Exercise per Week: 3 days    Minutes of Exercise per Session: 30 min  Stress: No Stress Concern Present (03/09/2020)   [employer] of Occupational Health - Occupational Stress Questionnaire    Feeling of Stress : Not at all  Social Connections: Moderately Integrated  (01/22/2024)   Social Connection and Isolation Panel    Frequency of Communication with Friends and Family: More than three times a week    Frequency of Social Gatherings with Friends and Family: More than three times a week    Attends Religious Services: Never    Database administrator or Organizations: Yes    Attends Banker Meetings: 1 to 4 times per year    Marital Status: Married  Catering manager Violence: Not At Risk (01/22/2024)   Humiliation, Afraid, Rape, and Kick questionnaire    Fear of Current or Ex-Partner: No    Emotionally Abused: No    Physically Abused: No    Sexually Abused: No     Current Outpatient Medications:    cetirizine (ZYRTEC) 10 MG tablet, Take 10 mg by mouth daily., Disp: , Rfl:    cholecalciferol (VITAMIN D3) 25 MCG (1000 UT) tablet, Take 1,000 Units by mouth daily., Disp: , Rfl:    Probiotic Product (PROBIOTIC-10 PO), Take by mouth., Disp: , Rfl:    triamcinolone  (NASACORT ) 55 MCG/ACT AERO nasal inhaler, Place 2 sprays into the nose daily., Disp: 1 each, Rfl: 12   triamcinolone  cream (KENALOG ) 0.1 %, Apply 1 Application topically 2 (two) times daily as needed (eczema rash areas)., Disp: 45 g, Rfl: 1  Allergies  Allergen Reactions   Penicillin G Other (See Comments)   Penicillins Rash   Sulfa Antibiotics Rash     ROS  Constitutional: Positive for mild weight gain Respiratory: Negative for cough and shortness of breath.   Cardiovascular: Negative for chest pain or palpitations.  Gastrointestinal: Negative for abdominal pain, no bowel changes.  Musculoskeletal: Negative  for gait problem or joint swelling.  Skin: Negative for rash.  Neurological: Negative for dizziness or headache.  No other specific complaints in a complete review of systems (except as listed in HPI above).   Objective  Vitals:   01/22/24 1420  BP: 122/80  Pulse: 84  Temp: 98.1 F (36.7 C)  SpO2: 99%  Weight: 178 lb (80.7 kg)  Height: 5' 2 (1.575 m)     Body mass index is 32.56 kg/m.  Physical Exam Constitutional:      Appearance: Normal appearance.  HENT:     Head: Normocephalic and atraumatic.     Right Ear: Tympanic membrane normal.     Left Ear: Tympanic membrane normal.     Nose: Nose normal.     Mouth/Throat:     Mouth: Mucous membranes are moist.  Eyes:     Pupils: Pupils are equal, round, and reactive to light.  Cardiovascular:     Rate and Rhythm: Normal rate and regular rhythm.     Pulses: Normal pulses.     Heart sounds: Normal heart sounds.  Pulmonary:     Effort: Pulmonary effort is normal.     Breath sounds: Normal breath sounds.  Abdominal:     General: Abdomen is flat. Bowel sounds are normal.     Palpations: Abdomen is soft.     Tenderness: There is abdominal tenderness in the right lower quadrant and left lower quadrant. There is no right CVA tenderness or left CVA tenderness.  Musculoskeletal:        General: Normal range of motion.     Cervical back: Normal range of motion and neck supple.  Skin:    General: Skin is warm and dry.  Neurological:     General: No focal deficit present.     Mental Status: She is alert and oriented to person, place, and time. Mental status is at baseline.  Psychiatric:        Mood and Affect: Mood normal.        Behavior: Behavior normal.        Thought Content: Thought content normal.        Judgment: Judgment normal.      No results found for this or any previous visit (from the past 2160 hours).      Fall Risk:    01/22/2024    2:33 PM 06/03/2022   11:08 AM 07/23/2021    9:24 AM 04/16/2021   11:17 AM 03/09/2020   10:17 AM  Fall Risk   Falls in the past year? 0 0 0 0 0  Number falls in past yr: 0 0 0 0 0  Injury with Fall? 0 0 0 0 0  Risk for fall due to :  No Fall Risks No Fall Risks    Follow up Falls evaluation completed Falls prevention discussed;Education provided;Falls evaluation completed Falls prevention discussed;Education provided  Falls  evaluation completed  Falls evaluation completed      Data saved with a previous flowsheet row definition     Functional Status Survey: Is the patient deaf or have difficulty hearing?: No Does the patient have difficulty seeing, even when wearing glasses/contacts?: No Does the patient have difficulty concentrating, remembering, or making decisions?: No Does the patient have difficulty walking or climbing stairs?: No Does the patient have difficulty dressing or bathing?: No Does the patient have difficulty doing errands alone such as visiting a doctor's office or shopping?: No   Assessment & Plan  Problem List  Items Addressed This Visit       Nervous and Auditory   Chronic bilateral low back pain with bilateral sciatica   [facility].  Patient reports she is doing okay right now, but reports this has been a chronic problem and would like to see ortho.       Relevant Orders   AMB referral to orthopedics     Musculoskeletal and Integument   Dyshidrotic eczema   Refill of kenalog  cream sent in.       Relevant Medications   triamcinolone  cream (KENALOG ) 0.1 %     Other   Mixed hyperlipidemia   Labs ordered      Relevant Orders   Lipid panel   Class 1 obesity due to excess calories with serious comorbidity and body mass index (BMI) of 32.0 to 32.9 in adult   Wt Readings from Last 3 Encounters:  01/22/24 178 lb (80.7 kg)  01/12/24 173 lb (78.5 kg)  06/03/22 172 lb 12.8 oz (78.4 kg)   Body mass index is 32.56 kg/m.  Flowsheet Row Office Visit from 01/22/2024 in [facility]  1 37 inches   Patient reports weight gain and would like to see a nutritionist.  Referral placed.       Other Visit Diagnoses       Annual physical exam    -  Primary   Relevant Orders   CBC with Differential/Platelet   Comprehensive metabolic panel with GFR   Lipid panel   Hemoglobin A1c   TSH     Screening for deficiency anemia        Relevant Orders   CBC with Differential/Platelet     Screening for diabetes mellitus       Relevant Orders   Hemoglobin A1c     Screening for thyroid disorder       Relevant Orders   TSH     Need for influenza vaccination       Relevant Orders   Flu vaccine trivalent PF, 6mos and older(Flulaval,Afluria,Fluarix,Fluzone) (Completed)     Lower abdominal pain       she has been experiencing chronic lower abdominal pain. currently working with GYN for fibroids.  Has ultrasound scheduled 02/04/2024     Weight gain       patient reports she has had weight gain and would like to see a nutritionist.  referral placed   Relevant Orders   Referral to Nutrition and Diabetes Services        -USPSTF grade A and B recommendations reviewed with patient; age-appropriate recommendations, preventive care, screening tests, etc discussed and encouraged; healthy living encouraged; see AVS for patient education given to patient -Discussed importance of 150 minutes of physical activity weekly, eat two servings of fish weekly, eat one serving of tree nuts ( cashews, pistachios, pecans, almonds.SABRA) every other day, eat 6 servings of fruit/vegetables daily and drink plenty of water and avoid sweet beverages.   -Reviewed Health Maintenance: YES  I have reviewed this encounter including the documentation in this note and/or discussed this patient with the provider, Aislinn Womack, SNP, I am certifying that I agree with the content of this note as supervising/preceptor nurse practitioner.  Mliss Spray, FNP-C [facility] 01/22/2024, 3:37 PM

## 2024-01-22 NOTE — Assessment & Plan Note (Signed)
 Wt Readings from Last 3 Encounters:  01/22/24 178 lb (80.7 kg)  01/12/24 173 lb (78.5 kg)  06/03/22 172 lb 12.8 oz (78.4 kg)   Body mass index is 32.56 kg/m.  Flowsheet Row Office Visit from 01/22/2024 in [facility]  1 37 inches   Patient reports weight gain and would like to see a nutritionist.  Referral placed.

## 2024-01-22 NOTE — Assessment & Plan Note (Signed)
 Labs ordered.

## 2024-01-22 NOTE — Assessment & Plan Note (Signed)
 Refill of kenalog  cream sent in.

## 2024-01-23 ENCOUNTER — Ambulatory Visit: Payer: Self-pay | Admitting: Nurse Practitioner

## 2024-01-23 DIAGNOSIS — R7989 Other specified abnormal findings of blood chemistry: Secondary | ICD-10-CM

## 2024-01-24 LAB — TEST AUTHORIZATION

## 2024-01-24 LAB — CBC WITH DIFFERENTIAL/PLATELET
Absolute Lymphocytes: 2174 {cells}/uL (ref 850–3900)
Absolute Monocytes: 454 {cells}/uL (ref 200–950)
Basophils Absolute: 43 {cells}/uL (ref 0–200)
Basophils Relative: 0.6 %
Eosinophils Absolute: 158 {cells}/uL (ref 15–500)
Eosinophils Relative: 2.2 %
HCT: 41.1 % (ref 35.0–45.0)
Hemoglobin: 13.4 g/dL (ref 11.7–15.5)
MCH: 26.8 pg — ABNORMAL LOW (ref 27.0–33.0)
MCHC: 32.6 g/dL (ref 32.0–36.0)
MCV: 82.2 fL (ref 80.0–100.0)
MPV: 10.6 fL (ref 7.5–12.5)
Monocytes Relative: 6.3 %
Neutro Abs: 4370 {cells}/uL (ref 1500–7800)
Neutrophils Relative %: 60.7 %
Platelets: 333 Thousand/uL (ref 140–400)
RBC: 5 Million/uL (ref 3.80–5.10)
RDW: 13.2 % (ref 11.0–15.0)
Total Lymphocyte: 30.2 %
WBC: 7.2 Thousand/uL (ref 3.8–10.8)

## 2024-01-24 LAB — COMPREHENSIVE METABOLIC PANEL WITH GFR
AG Ratio: 1.5 (calc) (ref 1.0–2.5)
ALT: 11 U/L (ref 6–29)
AST: 13 U/L (ref 10–35)
Albumin: 4.4 g/dL (ref 3.6–5.1)
Alkaline phosphatase (APISO): 59 U/L (ref 31–125)
BUN: 13 mg/dL (ref 7–25)
CO2: 27 mmol/L (ref 20–32)
Calcium: 9.4 mg/dL (ref 8.6–10.2)
Chloride: 105 mmol/L (ref 98–110)
Creat: 0.68 mg/dL (ref 0.50–0.99)
Globulin: 3 g/dL (ref 1.9–3.7)
Glucose, Bld: 91 mg/dL (ref 65–99)
Potassium: 4.2 mmol/L (ref 3.5–5.3)
Sodium: 139 mmol/L (ref 135–146)
Total Bilirubin: 0.5 mg/dL (ref 0.2–1.2)
Total Protein: 7.4 g/dL (ref 6.1–8.1)
eGFR: 107 mL/min/1.73m2 (ref >=60)

## 2024-01-24 LAB — TSH: TSH: 0.31 m[IU]/L — ABNORMAL LOW

## 2024-01-24 LAB — LIPID PANEL
Cholesterol: 243 mg/dL — ABNORMAL HIGH (ref <200)
HDL: 42 mg/dL — ABNORMAL LOW (ref >=50)
LDL Cholesterol (Calc): 146 mg/dL — ABNORMAL HIGH
Non-HDL Cholesterol (Calc): 201 mg/dL — ABNORMAL HIGH (ref <130)
Total CHOL/HDL Ratio: 5.8 (calc) — ABNORMAL HIGH (ref <5.0)
Triglycerides: 356 mg/dL — ABNORMAL HIGH (ref <150)

## 2024-01-24 LAB — EXTRA

## 2024-01-24 LAB — T3, FREE: T3, Free: 3 pg/mL (ref 2.3–4.2)

## 2024-01-24 LAB — T4, FREE: Free T4: 1.3 ng/dL (ref 0.8–1.8)

## 2024-01-24 LAB — HEMOGLOBIN A1C
Hgb A1c MFr Bld: 5.5 % (ref <5.7)
Mean Plasma Glucose: 111 mg/dL
eAG (mmol/L): 6.2 mmol/L

## 2024-02-04 ENCOUNTER — Ambulatory Visit

## 2024-02-04 DIAGNOSIS — R1032 Left lower quadrant pain: Secondary | ICD-10-CM | POA: Diagnosis not present

## 2024-02-04 DIAGNOSIS — D219 Benign neoplasm of connective and other soft tissue, unspecified: Secondary | ICD-10-CM

## 2024-02-04 DIAGNOSIS — Z8742 Personal history of other diseases of the female genital tract: Secondary | ICD-10-CM

## 2024-02-10 ENCOUNTER — Telehealth: Payer: Self-pay | Admitting: Obstetrics and Gynecology

## 2024-02-10 NOTE — Telephone Encounter (Signed)
 LM for pt with GYN u/s results which show increased size and # of leio, largest leio is 10.9 cm. Pt is without sx except occas LLQ pains. Can do tx vs follow for now. Pt to f/u prn.

## 2024-02-12 ENCOUNTER — Encounter: Payer: Self-pay | Admitting: Obstetrics and Gynecology

## 2024-02-12 DIAGNOSIS — D219 Benign neoplasm of connective and other soft tissue, unspecified: Secondary | ICD-10-CM

## 2024-02-12 NOTE — Telephone Encounter (Signed)
 Spoke with pt. Sent her u/s findings in MyChart for her records. Discussed UFE vs myomectomy vs hyst. Pt considering hyst. Doesn't have significant periods so BC probably not going to achieve the desired results of treating the fibroids given their size. Pt to consider options and will f/u re: GYN MD ref prn.

## 2024-02-12 NOTE — Telephone Encounter (Signed)
 LMTRC

## 2024-02-12 NOTE — Telephone Encounter (Signed)
 Patient returning call. Requesting return call to discuss results.

## 2024-03-10 ENCOUNTER — Encounter: Admitting: Dietician

## 2024-03-16 DIAGNOSIS — N920 Excessive and frequent menstruation with regular cycle: Secondary | ICD-10-CM | POA: Diagnosis not present

## 2024-03-16 DIAGNOSIS — D219 Benign neoplasm of connective and other soft tissue, unspecified: Secondary | ICD-10-CM | POA: Diagnosis not present

## 2024-04-12 ENCOUNTER — Encounter: Attending: Nurse Practitioner | Admitting: Dietician

## 2024-04-12 ENCOUNTER — Encounter: Payer: Self-pay | Admitting: Dietician

## 2024-04-12 VITALS — Ht 62.5 in | Wt 173.8 lb

## 2024-04-12 DIAGNOSIS — Z6832 Body mass index (BMI) 32.0-32.9, adult: Secondary | ICD-10-CM

## 2024-04-12 DIAGNOSIS — E66811 Obesity, class 1: Secondary | ICD-10-CM | POA: Diagnosis not present

## 2024-04-12 DIAGNOSIS — E6609 Other obesity due to excess calories: Secondary | ICD-10-CM

## 2024-04-12 NOTE — Progress Notes (Signed)
 Medical Nutrition Therapy: Visit start time: 1305  end time: 1406  Assessment:   Referral Diagnosis: unintentional weight gain Other medical history/ diagnoses: hysterectomy scheduled for 05/2024 due to fibroids; hx of vitamin D  deficiency; perimenopause Psychosocial issues/ stress concerns: none  Medications, supplements: reconciled list in medical record    Current weight: 173.8lbs with shoes, hoodie  Height: 5'2.5 BMI: 31.28 Patient's personal weight goal: 140lbs  Progress and evaluation:  Patient participated in weight watchers program about 10 yrs ago, which worked well, maintained weight loss for 3 years, until COVID pandemic. Reports more rapid increase in weight since having hormonal changes with peri-menopause  Food allergies: roasted eggplant intolerance Special diet practices: none She reports enjoying cooking, including healthy, whole foods Patient seeks help with achieving weight loss during perimenopausal time, and upcoming hysterectomy   Dietary Intake:  Usual eating pattern includes 2 meals and 2-3 snacks per day. Dining out frequency: 2-4 meals per week. Who plans meals/ buys groceries? self Who prepares meals? self  Breakfast: drinks coffee, no food Snack: none Lunch: 12-1pm sandwich with turkey; leftovers; egg and cottage cheese on ww toast chili oil Snack: tries to avoid; rarely chips, no sweets Supper: loves to cook, esp ethnic foods, but also american -- protein + carb + veg brocc, green beans, brussels sprouts, etc; out on weekends Snack: often on weekend -- chips/ doritos Beverages: water; hot tea; occ soda ie ginger ale on weekends  Physical activity: trying to increase stationary bike, walking to >3 times a week; currently some walking 42 minutes at a time   Intervention:   Nutrition Care Education:   Basic nutrition: appropriate nutrient balance; appropriate meal and snack schedule; general nutrition guidelines    Weight control: identifying  healthy weight; importance of low sugar and low fat choices; portion control strategies; estimated energy needs for weight loss at 1300 kcal, provided guidance for 45% CHO, 25% protein, and 30% fat; limiting/ avoiding processed starches/ sugars esp during perimenopause/ menopause; managing food cravings; benefits of whole foods, plant based eating patterns, Mediterranean or other ethnic/ historic cultural eating patterns; role of physical activity   Other intervention notes: Patient has basic understanding of balanced eating pattern and healthy choices. She is motivated to make positive changes to promote weight loss. Established goals with direction from patient.   Nutritional Diagnosis:  [place]-3.3 Overweight/obesity and -3.4 Unintentional weight gain As related to hormonal changes.  As evidenced by patient with current BMI of 31.   Education Materials given:  Designer, Industrial/product with food lists, sample meal pattern Tips for Managing Food Cravings Keys to Successful Weight Loss Visit summary with goals/ instructions   Learner/ who was taught:  Patient   Level of understanding: Verbalizes/ demonstrates competency  Demonstrated degree of understanding via:   Teach back Learning barriers: None  Willingness to learn/ readiness for change: Eager, change in progress  Monitoring and Evaluation:  Dietary intake, exercise, and body weight      follow up: 06/21/24

## 2024-04-12 NOTE — Patient Instructions (Signed)
 Continue to make mostly healthy food choices, great job! Control portions of starchy foods, choose more whole grain/ high fiber options. Work to gradually increase exercise and/or daily movement. Maintain healthy choices through weekend days; maybe allow for 1 meal or 1/2 day as a splurge rather than the whole weekend.

## 2024-04-20 NOTE — H&P (Signed)
 Preoperative History and Physical  Chief Complaint: Sarah Carrillo is a 49 y.o. [id number] here for surgical management of menorrhagia with regular cycle, fibroid uterus.   No significant preoperative concerns.  History of Present Illness: Sarah Carrillo is a 49 year old female with fibroids who presents with worsening sciatic pain and heavy menstrual bleeding.   She has a history of fibroids, initially discovered four years ago during an ultrasound for heavy bleeding and cramping. At that time, the fibroids were considered manageable, and a 'wait and see' approach was taken.   A recent ultrasound on October 29th revealed significant growth in the largest fibroid, now measuring almost 11 cm, and the presence of four additional fibroids, with two located on the posterior uterus and the largest on the right. During the last ultrasound, the technician could not find her right ovary due to the enlargement.   The fibroids are causing significant symptoms, including worsening sciatic pain, which she describes as 'so much worse.' She believes the pain is related to the fibroids' size and location. Additionally, she experiences 'really, really, really heavy periods' and other typical symptoms associated with fibroids.   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   Last pap smear 04/2021: NILM, HPV negative   Ultrasound Report from [facility]: Location: [facility] Date of Service: 02/04/2024 Ordering Provider: Bernarda Schroeder, [facility]   Indications:LLQ pain and H/O Uterine Fibroids. Findings:  The uterus is anteverted and measures 12.9 x 6.9 x 11.4 cm with a uterine volume of 529.47 ml. Echo texture is heterogenous with evidence of focal masses. Within the uterus are multiple suspected fibroids:   Fibroid 1: 3.5 x 2.7 x 3.6 cm (Ant/Fundal) Intramural. Fibroid 2: 10.2 x 10.3 x 10.9 cm (Ped/Rt). Fibroid 3: 3.6 x 3.0 x 3.3 cm (  Post/Fundus) Intramural.  Fibroid 4: 2.9 x 1.9 x 3.0 cm (Post/Rt fundus) Intramural. Fibroid 5: 2.7 x 2.0 x 3.4 cm (Post / Lt fundus) Intramural. Fibroid 6: 4.4 x 3.1 x 3.9 cm (Ped /Rt).   The Endometrium measures 6.7 mm.   Right Ovary was not identified due to large Right pedunculated fibroid. Left Ovary measures 3.1 x 2.2 x 2.0 cm. It is normal in appearance. Survey of the adnexa demonstrates no adnexal masses. There is no free fluid in the cul de sac.  Proposed surgery: Robot assisted total laparoscopic hysterectomy, bilateral salpingectomy, cystoscopy   Past Medical History:  Diagnosis Date   Fibroid 2021   Hyperlipidemia 2021   Past Surgical History:  Procedure Laterality Date   COLONOSCOPY  06/07/2021   Dr Ruel Kung   OB History  Gravida Para Term Preterm AB Living  4 2 2  2 2   SAB IAB Ectopic Molar Multiple Live Births       2    # Outcome Date GA Lbr Len/2nd Weight Sex Type Anes PTL Lv  4 AB           3 AB           2 Term      Vag-Spont   LIV  1 Term      Vag-Spont   LIV  Patient denies any other pertinent gynecologic issues.   Current Outpatient Medications on File Prior to Visit  Medication Sig Dispense Refill   cetirizine (ZYRTEC) 10 MG tablet Take 10 mg by mouth once daily     cholecalciferol (VITAMIN D3) 1000 unit tablet Take  1,000 Units by mouth once daily     No current facility-administered medications on file prior to visit.   Allergies  Allergen Reactions   Penicillin Unknown   Penicillins Other (See Comments) and Rash   Sulfa (Sulfonamide Antibiotics) Rash    Social History:   reports that she has never smoked. She has never used smokeless tobacco. She reports current alcohol use of about 2.0 - 3.0 standard drinks of alcohol per week. She reports that she does not use drugs.  Family History  Problem Relation Name Age of Onset   Diabetes Mother Philomena Paul-Davis    High blood pressure (Hypertension) Mother Philomena Paul-Davis     Hyperlipidemia (Elevated cholesterol) Mother Philomena Paul-Davis    Diabetes Father M.S. Swamickannu        deceased    Review of Systems: Noncontributory  PHYSICAL EXAM: Blood pressure 114/76, pulse 84, height 157.5 cm (5' 2), weight 76.2 kg (168 lb). CONSTITUTIONAL: Well-developed, well-nourished female in no acute distress.  HENT:  Normocephalic, atraumatic, External right and left ear normal. Oropharynx is clear and moist EYES: Conjunctivae and EOM are normal. Pupils are equal, round, and reactive to light. No scleral icterus.  NECK: Normal range of motion, supple, no masses SKIN: Skin is warm and dry. No rash noted. Not diaphoretic. No erythema. No pallor. NEUROLGIC: Alert and oriented to person, place, and time. Normal reflexes, muscle tone coordination. No cranial nerve deficit noted. PSYCHIATRIC: Normal mood and affect. Normal behavior. Normal judgment and thought content. CARDIOVASCULAR: Normal heart rate noted, regular rhythm RESPIRATORY: Effort and breath sounds normal, no problems with respiration noted ABDOMEN: Soft, nontender, nondistended. PELVIC: Deferred MUSCULOSKELETAL: Normal range of motion. No edema and no tenderness. 2+ distal pulses.  Labs: No results found for this or any previous visit (from the past 2 weeks).  Imaging Studies: No results found.  Assessment: Problem List Items Addressed This Visit   None Visit Diagnoses       Menorrhagia with regular cycle    -  Primary     Fibroid           Plan: Patient will undergo surgical management with the above-noted surgery.   The risks of surgery were discussed in detail with the patient including but not limited to: bleeding which may require transfusion or reoperation; infection which may require antibiotics; injury to surrounding organs which may involve bowel, bladder, ureters ; need for additional procedures including laparoscopy or laparotomy; thromboembolic phenomenon, surgical site problems and other  postoperative/anesthesia complications. Likelihood of success in alleviating the patient's condition was discussed. Routine postoperative instructions will be reviewed with the patient and her family in detail after surgery.  The patient concurred with the proposed plan, giving informed written consent for the surgery.   Preoperative prophylactic antibiotics, as indicated, and SCDs ordered on call to the OR.    Return in 29 days (on 05/19/2024) for Post-op incision check, Keep previously scheduled appts.   Attestation Statement:   I personally performed the service. (TP)  Kaylise Blakeley TORIBIO MACE, MD  [facility] 04/20/2024 12:26 PM

## 2024-05-05 ENCOUNTER — Encounter
Admission: RE | Admit: 2024-05-05 | Discharge: 2024-05-05 | Disposition: A | Discharge status: Home / Self Care | Source: Ambulatory Visit | Attending: Obstetrics and Gynecology | Admitting: Obstetrics and Gynecology

## 2024-05-05 ENCOUNTER — Other Ambulatory Visit: Payer: Self-pay

## 2024-05-05 DIAGNOSIS — N920 Excessive and frequent menstruation with regular cycle: Secondary | ICD-10-CM

## 2024-05-05 DIAGNOSIS — Z01812 Encounter for preprocedural laboratory examination: Secondary | ICD-10-CM

## 2024-05-05 HISTORY — DX: Excessive and frequent menstruation with regular cycle: N92.0

## 2024-05-05 HISTORY — DX: Sciatica, unspecified side: M54.30

## 2024-05-05 HISTORY — DX: Leiomyoma of uterus, unspecified: D25.9

## 2024-05-05 NOTE — Patient Instructions (Addendum)
 Your procedure is scheduled on: 05/12/24 - Wednesday Report to the Registration Desk on the 1st floor of the [facility]. To find out your arrival time, please call [phone] between 1PM - 3PM on: 05/11/24 - Tuesday If your arrival time is 6:00 am, do not arrive before that time as the [facility] entrance doors do not open until 6:00 am.  REMEMBER: Instructions that are not followed completely may result in serious medical risk, up to and including death; or upon the discretion of your surgeon and anesthesiologist your surgery may need to be rescheduled.  Do not eat food or drink any liquids after midnight the night before surgery.  No gum chewing or hard candies.  One week prior to surgery: Stop Anti-inflammatories (NSAIDS) such as Advil, Aleve, Ibuprofen, Motrin, Naproxen, Naprosyn and Aspirin based products such as Excedrin, Goody's Powder, BC Powder You may take Tylenol if needed for pain up until the day of surgery. .  Stop ANY OVER THE COUNTER supplements until after surgery - VITAMIN D    ON THE DAY OF SURGERY ONLY TAKE THESE MEDICATIONS WITH SIPS OF WATER:  none  No Alcohol for 24 hours before or after surgery.  No Smoking including e-cigarettes for 24 hours before surgery.  No chewable tobacco products for at least 6 hours before surgery.  No nicotine patches on the day of surgery.  Do not use any recreational drugs for at least a week (preferably 2 weeks) before your surgery.  Please be advised that the combination of cocaine and anesthesia may have negative outcomes, up to and including death. If you test positive for cocaine, your surgery will be cancelled.  On the morning of surgery brush your teeth with toothpaste and water, you may rinse your mouth with mouthwash if you wish. Do not swallow any toothpaste or mouthwash.  Use CHG Soap or wipes as directed on instruction sheet.  Do not wear jewelry, make-up, hairpins, clips or nail polish.  For welded  (permanent) jewelry: bracelets, anklets, waist bands, etc.  Please have this removed prior to surgery.  If it is not removed, there is a chance that hospital personnel will need to cut it off on the day of surgery.  Do not wear lotions, powders, or perfumes.   Do not shave body hair from the neck down 48 hours before surgery.  Contact lenses, hearing aids and dentures may not be worn into surgery.  Do not bring valuables to the hospital. [facility] is not responsible for any missing/lost belongings or valuables.   Notify your doctor if there is any change in your medical condition (cold, fever, infection).  Wear comfortable clothing (specific to your surgery type) to the hospital.  After surgery, you can help prevent lung complications by doing breathing exercises.  Take deep breaths and cough every 1-2 hours. Your doctor may order a device called an Incentive Spirometer to help you take deep breaths.  When coughing or sneezing, hold a pillow firmly against your incision with both hands. This is called splinting. Doing this helps protect your incision. It also decreases belly discomfort.  If you are being admitted to the hospital overnight, leave your suitcase in the car. After surgery it may be brought to your room.  In case of increased patient census, it may be necessary for you, the patient, to continue your postoperative care in the Same Day Surgery department.  If you are being discharged the day of surgery, you will not be allowed to drive home.  You will need a responsible individual to drive you home and stay with you for 24 hours after surgery.   If you are taking public transportation, you will need to have a responsible individual with you.  Please call the Pre-admissions Testing Dept. at [phone] if you have any questions about these instructions.  Surgery Visitation Policy:  Patients having surgery or a procedure may have two visitors.  Children under the age of  66 must have an adult with them who is not the patient.  Inpatient Visitation:    Visiting hours are 7 a.m. to 8 p.m. Up to four visitors are allowed at one time in a patient room. The visitors may rotate out with other people during the day.  One visitor age 35 or older may stay with the patient overnight and must be in the room by 8 p.m.   Merchandiser, Retail to address health-related social needs:  [email or web address]                                                                                                            Preparing for Surgery with CHLORHEXIDINE GLUCONATE (CHG) Soap  Chlorhexidine Gluconate (CHG) Soap  o An antiseptic cleaner that kills germs and bonds with the skin to continue killing germs even after washing  o Used for showering the night before surgery and morning of surgery  Before surgery, you can play an important role by reducing the number of germs on your skin.  CHG (Chlorhexidine gluconate) soap is an antiseptic cleanser which kills germs and bonds with the skin to continue killing germs even after washing.  Please do not use if you have an allergy to CHG or antibacterial soaps. If your skin becomes reddened/irritated stop using the CHG.  1. Shower the NIGHT BEFORE SURGERY with CHG soap.  2. If you choose to wash your hair, wash your hair first as usual with your normal shampoo.  3. After shampooing, rinse your hair and body thoroughly to remove the shampoo.  4. Use CHG as you would any other liquid soap. You can apply CHG directly to the skin and wash gently with a clean washcloth.  5. Apply the CHG soap to your body only from the neck down. Do not use on open wounds or open sores. Avoid contact with your eyes, ears, mouth, and genitals (private parts). Wash face and genitals (private parts) with your normal soap.  6. Wash thoroughly, paying special attention to the area where your surgery will be performed.  7. Thoroughly  rinse your body with warm water.  8. Do not shower/wash with your normal soap after using and rinsing off the CHG soap.  9. Do not use lotions, oils, etc., after showering with CHG.  10. Pat yourself dry with a clean towel.  11. Wear clean pajamas to bed the night before surgery.  12. Place clean sheets on your bed the night of your shower and do not sleep with pets.  13. Do not apply any deodorants/lotions/powders.  14. Please wear clean clothes  to the hospital.  15. Remember to brush your teeth with your regular toothpaste.

## 2024-05-06 ENCOUNTER — Encounter
Admission: RE | Admit: 2024-05-06 | Discharge: 2024-05-06 | Disposition: A | Discharge status: Home / Self Care | Source: Ambulatory Visit | Attending: Obstetrics and Gynecology | Admitting: Obstetrics and Gynecology

## 2024-05-06 DIAGNOSIS — N920 Excessive and frequent menstruation with regular cycle: Secondary | ICD-10-CM | POA: Diagnosis not present

## 2024-05-06 DIAGNOSIS — Z01812 Encounter for preprocedural laboratory examination: Secondary | ICD-10-CM | POA: Diagnosis present

## 2024-05-06 LAB — CBC
HCT: 42.3 % (ref 36.0–46.0)
Hemoglobin: 13.9 g/dL (ref 12.0–15.0)
MCH: 27.1 pg (ref 26.0–34.0)
MCHC: 32.9 g/dL (ref 30.0–36.0)
MCV: 82.5 fL (ref 80.0–100.0)
Platelets: 292 10*3/uL (ref 150–400)
RBC: 5.13 MIL/uL — ABNORMAL HIGH (ref 3.87–5.11)
RDW: 13.3 % (ref 11.5–15.5)
WBC: 6.4 10*3/uL (ref 4.0–10.5)
nRBC: 0 % (ref 0.0–0.2)

## 2024-05-06 LAB — TYPE AND SCREEN
ABO/RH(D): O POS
Antibody Screen: NEGATIVE

## 2024-05-10 MED ORDER — ACETAMINOPHEN 10 MG/ML IV SOLN
INTRAVENOUS | Status: AC
  Filled 2024-05-10: qty 300

## 2024-05-12 ENCOUNTER — Encounter
Admission: RE | Disposition: A | Discharge status: Home / Self Care | Payer: Self-pay | Source: Ambulatory Visit | Attending: Obstetrics and Gynecology

## 2024-05-12 ENCOUNTER — Ambulatory Visit
Admission: RE | Admit: 2024-05-12 | Discharge: 2024-05-12 | Disposition: A | Discharge status: Home / Self Care | Source: Ambulatory Visit | Attending: Obstetrics and Gynecology | Admitting: Obstetrics and Gynecology

## 2024-05-12 ENCOUNTER — Encounter: Payer: Self-pay | Admitting: Anesthesiology

## 2024-05-12 ENCOUNTER — Ambulatory Visit: Payer: Self-pay | Admitting: Urgent Care

## 2024-05-12 ENCOUNTER — Encounter: Payer: Self-pay | Admitting: Obstetrics and Gynecology

## 2024-05-12 ENCOUNTER — Other Ambulatory Visit: Payer: Self-pay

## 2024-05-12 DIAGNOSIS — N920 Excessive and frequent menstruation with regular cycle: Secondary | ICD-10-CM | POA: Diagnosis present

## 2024-05-12 DIAGNOSIS — Z01812 Encounter for preprocedural laboratory examination: Secondary | ICD-10-CM

## 2024-05-12 DIAGNOSIS — D259 Leiomyoma of uterus, unspecified: Secondary | ICD-10-CM | POA: Diagnosis present

## 2024-05-12 LAB — ABO/RH: ABO/RH(D): O POS

## 2024-05-12 LAB — POCT PREGNANCY, URINE: Preg Test, Ur: NEGATIVE

## 2024-05-12 MED ORDER — ACETAMINOPHEN 500 MG PO TABS
1000.0000 mg | ORAL_TABLET | ORAL | Status: AC
  Administered 2024-05-12: 1000 mg via ORAL

## 2024-05-12 MED ORDER — SOD CITRATE-CITRIC ACID 500-334 MG/5ML PO SOLN
30.0000 mL | ORAL | Status: DC
  Filled 2024-05-12: qty 30

## 2024-05-12 MED ORDER — CHLORHEXIDINE GLUCONATE 0.12 % MT SOLN
15.0000 mL | Freq: Once | OROMUCOSAL | Status: AC
  Administered 2024-05-12: 15 mL via OROMUCOSAL

## 2024-05-12 MED ORDER — OXYCODONE HCL 5 MG PO TABS
5.0000 mg | ORAL_TABLET | Freq: Once | ORAL | Status: AC | PRN
  Administered 2024-05-12: 5 mg via ORAL

## 2024-05-12 MED ORDER — EPHEDRINE 5 MG/ML INJ
INTRAVENOUS | Status: AC
  Filled 2024-05-12: qty 5

## 2024-05-12 MED ORDER — LACTATED RINGERS IV SOLN: INTRAVENOUS | Status: DC

## 2024-05-12 MED ORDER — ONDANSETRON 4 MG PO TBDP: 4.0000 mg | ORAL_TABLET | Freq: Four times a day (QID) | ORAL | 0 refills | Status: AC | PRN

## 2024-05-12 MED ORDER — BUPIVACAINE HCL (PF) 0.5 % IJ SOLN
INTRAMUSCULAR | Status: AC
  Filled 2024-05-12: qty 30

## 2024-05-12 MED ORDER — FLUORESCEIN SODIUM 10 % IV SOLN
INTRAVENOUS | Status: AC
  Filled 2024-05-12: qty 5

## 2024-05-12 MED ORDER — SUGAMMADEX SODIUM 200 MG/2ML IV SOLN
INTRAVENOUS | Status: DC | PRN
  Administered 2024-05-12: 200 mg via INTRAVENOUS

## 2024-05-12 MED ORDER — VASOPRESSIN 20 UNIT/ML IV SOLN
INTRAVENOUS | Status: DC | PRN
  Administered 2024-05-12 (×2): 15 mL via INTRAMUSCULAR

## 2024-05-12 MED ORDER — GABAPENTIN 300 MG PO CAPS: 600.0000 mg | ORAL_CAPSULE | ORAL | Status: DC

## 2024-05-12 MED ORDER — PROPOFOL 10 MG/ML IV BOLUS
INTRAVENOUS | Status: AC
  Filled 2024-05-12: qty 20

## 2024-05-12 MED ORDER — METOPROLOL TARTRATE 5 MG/5ML IV SOLN
INTRAVENOUS | Status: DC | PRN
  Administered 2024-05-12: 2 mg via INTRAVENOUS
  Administered 2024-05-12: 1 mg via INTRAVENOUS

## 2024-05-12 MED ORDER — LIDOCAINE HCL (PF) 2 % IJ SOLN
INTRAMUSCULAR | Status: AC
  Filled 2024-05-12: qty 5

## 2024-05-12 MED ORDER — DEXAMETHASONE SOD PHOSPHATE PF 10 MG/ML IJ SOLN
INTRAMUSCULAR | Status: DC | PRN
  Administered 2024-05-12: 8 mg via INTRAVENOUS

## 2024-05-12 MED ORDER — ONDANSETRON HCL 4 MG/2ML IJ SOLN
INTRAMUSCULAR | Status: DC | PRN
  Administered 2024-05-12: 4 mg via INTRAVENOUS

## 2024-05-12 MED ORDER — CELECOXIB 200 MG PO CAPS
ORAL_CAPSULE | ORAL | Status: AC
  Filled 2024-05-12: qty 2

## 2024-05-12 MED ORDER — POVIDONE-IODINE 10 % EX SWAB
2.0000 | Freq: Once | CUTANEOUS | Status: AC
  Administered 2024-05-12: 2 via TOPICAL

## 2024-05-12 MED ORDER — ROCURONIUM BROMIDE 10 MG/ML (PF) SYRINGE
PREFILLED_SYRINGE | INTRAVENOUS | Status: AC
  Filled 2024-05-12: qty 10

## 2024-05-12 MED ORDER — KETAMINE HCL 50 MG/5ML IJ SOSY
PREFILLED_SYRINGE | INTRAMUSCULAR | Status: AC
  Filled 2024-05-12: qty 5

## 2024-05-12 MED ORDER — ROCURONIUM BROMIDE 100 MG/10ML IV SOLN
INTRAVENOUS | Status: DC | PRN
  Administered 2024-05-12: 10 mg via INTRAVENOUS
  Administered 2024-05-12 (×2): 30 mg via INTRAVENOUS
  Administered 2024-05-12: 20 mg via INTRAVENOUS
  Administered 2024-05-12: 50 mg via INTRAVENOUS
  Administered 2024-05-12: 20 mg via INTRAVENOUS

## 2024-05-12 MED ORDER — HEMOSTATIC AGENTS (NO CHARGE) OPTIME
TOPICAL | Status: DC | PRN
  Administered 2024-05-12: 1 via TOPICAL

## 2024-05-12 MED ORDER — METHYLENE BLUE 20 MG/2ML IV SOSY
PREFILLED_SYRINGE | INTRAVENOUS | Status: AC
  Filled 2024-05-12: qty 2

## 2024-05-12 MED ORDER — ONDANSETRON HCL 4 MG/2ML IJ SOLN
INTRAMUSCULAR | Status: AC
  Filled 2024-05-12: qty 2

## 2024-05-12 MED ORDER — FENTANYL CITRATE (PF) 100 MCG/2ML IJ SOLN
INTRAMUSCULAR | Status: AC
  Filled 2024-05-12: qty 2

## 2024-05-12 MED ORDER — 0.9 % SODIUM CHLORIDE (POUR BTL) OPTIME
TOPICAL | Status: DC | PRN
  Administered 2024-05-12: 500 mL

## 2024-05-12 MED ORDER — CEFAZOLIN SODIUM-DEXTROSE 2-4 GM/100ML-% IV SOLN
INTRAVENOUS | Status: AC
  Filled 2024-05-12: qty 100

## 2024-05-12 MED ORDER — PROPOFOL 10 MG/ML IV BOLUS
INTRAVENOUS | Status: AC
  Filled 2024-05-12: qty 40

## 2024-05-12 MED ORDER — DEXMEDETOMIDINE HCL IN NACL 80 MCG/20ML IV SOLN
INTRAVENOUS | Status: DC | PRN
  Administered 2024-05-12: 8 ug via INTRAVENOUS
  Administered 2024-05-12: 4 ug via INTRAVENOUS
  Administered 2024-05-12: 8 ug via INTRAVENOUS
  Administered 2024-05-12: 4 ug via INTRAVENOUS

## 2024-05-12 MED ORDER — SEVOFLURANE IN SOLN
RESPIRATORY_TRACT | Status: AC
  Filled 2024-05-12: qty 250

## 2024-05-12 MED ORDER — ACETAMINOPHEN 500 MG PO TABS
ORAL_TABLET | ORAL | Status: AC
  Filled 2024-05-12: qty 2

## 2024-05-12 MED ORDER — SODIUM CHLORIDE 0.9 % IV SOLN: INTRAVENOUS | Status: DC

## 2024-05-12 MED ORDER — MIDAZOLAM HCL (PF) 2 MG/2ML IJ SOLN
INTRAMUSCULAR | Status: DC | PRN
  Administered 2024-05-12: 2 mg via INTRAVENOUS

## 2024-05-12 MED ORDER — DROPERIDOL 2.5 MG/ML IJ SOLN
0.6250 mg | Freq: Once | INTRAMUSCULAR | Status: AC | PRN
  Administered 2024-05-12: 0.625 mg via INTRAVENOUS

## 2024-05-12 MED ORDER — CELECOXIB 200 MG PO CAPS
400.0000 mg | ORAL_CAPSULE | ORAL | Status: AC
  Administered 2024-05-12: 400 mg via ORAL

## 2024-05-12 MED ORDER — VASOPRESSIN 20 UNIT/ML IV SOLN
INTRAVENOUS | Status: AC
  Filled 2024-05-12: qty 1

## 2024-05-12 MED ORDER — OXYCODONE HCL 5 MG PO TABS
ORAL_TABLET | ORAL | Status: AC
  Filled 2024-05-12: qty 1

## 2024-05-12 MED ORDER — PROPOFOL 500 MG/50ML IV EMUL
INTRAVENOUS | Status: DC | PRN
  Administered 2024-05-12: 20 ug/kg/min via INTRAVENOUS

## 2024-05-12 MED ORDER — EPHEDRINE SULFATE-NACL 50-0.9 MG/10ML-% IV SOSY
PREFILLED_SYRINGE | INTRAVENOUS | Status: DC | PRN
  Administered 2024-05-12: 10 mg via INTRAVENOUS

## 2024-05-12 MED ORDER — CEFAZOLIN SODIUM 1 G IJ SOLR
INTRAMUSCULAR | Status: AC
  Filled 2024-05-12: qty 20

## 2024-05-12 MED ORDER — OXYCODONE HCL 5 MG/5ML PO SOLN: 5.0000 mg | Freq: Once | ORAL | Status: AC | PRN

## 2024-05-12 MED ORDER — IBUPROFEN 600 MG PO TABS: 600.0000 mg | ORAL_TABLET | Freq: Four times a day (QID) | ORAL | 0 refills | Status: AC

## 2024-05-12 MED ORDER — DROPERIDOL 2.5 MG/ML IJ SOLN
INTRAMUSCULAR | Status: AC
  Filled 2024-05-12: qty 2

## 2024-05-12 MED ORDER — FLUORESCEIN SODIUM 10 % IV SOLN
INTRAVENOUS | Status: DC | PRN
  Administered 2024-05-12: 25 mg via INTRAVENOUS

## 2024-05-12 MED ORDER — PROPOFOL 1000 MG/100ML IV EMUL
INTRAVENOUS | Status: AC
  Filled 2024-05-12: qty 100

## 2024-05-12 MED ORDER — LIDOCAINE HCL (CARDIAC) PF 100 MG/5ML IV SOSY
PREFILLED_SYRINGE | INTRAVENOUS | Status: DC | PRN
  Administered 2024-05-12: 80 mg via INTRAVENOUS

## 2024-05-12 MED ORDER — BUPIVACAINE LIPOSOME 1.3 % IJ SUSP
INTRAMUSCULAR | Status: AC
  Filled 2024-05-12: qty 20

## 2024-05-12 MED ORDER — METHYLENE BLUE (ANTIDOTE) 1 % IV SOLN
INTRAVENOUS | Status: AC
  Filled 2024-05-12: qty 10

## 2024-05-12 MED ORDER — FENTANYL CITRATE (PF) 100 MCG/2ML IJ SOLN
INTRAMUSCULAR | Status: DC | PRN
  Administered 2024-05-12: 25 ug via INTRAVENOUS
  Administered 2024-05-12: 50 ug via INTRAVENOUS
  Administered 2024-05-12: 25 ug via INTRAVENOUS
  Administered 2024-05-12 (×2): 50 ug via INTRAVENOUS

## 2024-05-12 MED ORDER — SODIUM CHLORIDE (PF) 0.9 % IJ SOLN
INTRAMUSCULAR | Status: AC
  Filled 2024-05-12: qty 20

## 2024-05-12 MED ORDER — TRANEXAMIC ACID-NACL 1000-0.7 MG/100ML-% IV SOLN
INTRAVENOUS | Status: AC
  Filled 2024-05-12: qty 100

## 2024-05-12 MED ORDER — BUPIVACAINE HCL 0.5 % IJ SOLN
INTRAMUSCULAR | Status: DC | PRN
  Administered 2024-05-12: 10 mL

## 2024-05-12 MED ORDER — BUPIVACAINE HCL (PF) 0.25 % IJ SOLN
INTRAMUSCULAR | Status: AC
  Filled 2024-05-12: qty 60

## 2024-05-12 MED ORDER — SODIUM CHLORIDE 0.9 % IV SOLN: INTRAVENOUS | Status: DC | PRN

## 2024-05-12 MED ORDER — CEFAZOLIN SODIUM-DEXTROSE 2-4 GM/100ML-% IV SOLN
2.0000 g | INTRAVENOUS | Status: AC
  Administered 2024-05-12 (×2): 2 g via INTRAVENOUS

## 2024-05-12 MED ORDER — METRONIDAZOLE 500 MG/100ML IV SOLN
500.0000 mg | INTRAVENOUS | Status: AC
  Administered 2024-05-12: 500 mg via INTRAVENOUS
  Filled 2024-05-12: qty 100

## 2024-05-12 MED ORDER — ORAL CARE MOUTH RINSE: 15.0000 mL | Freq: Once | OROMUCOSAL | Status: AC

## 2024-05-12 MED ORDER — HYDROMORPHONE HCL 1 MG/ML IJ SOLN
INTRAMUSCULAR | Status: AC
  Filled 2024-05-12: qty 1

## 2024-05-12 MED ORDER — PROPOFOL 10 MG/ML IV BOLUS
INTRAVENOUS | Status: DC | PRN
  Administered 2024-05-12: 150 mg via INTRAVENOUS

## 2024-05-12 MED ORDER — KETAMINE HCL 10 MG/ML IJ SOLN
INTRAMUSCULAR | Status: DC | PRN
  Administered 2024-05-12: 20 mg via INTRAVENOUS
  Administered 2024-05-12 (×3): 10 mg via INTRAVENOUS

## 2024-05-12 MED ORDER — PHENYLEPHRINE 80 MCG/ML (10ML) SYRINGE FOR IV PUSH (FOR BLOOD PRESSURE SUPPORT)
PREFILLED_SYRINGE | INTRAVENOUS | Status: AC
  Filled 2024-05-12: qty 10

## 2024-05-12 MED ORDER — MIDAZOLAM HCL 2 MG/2ML IJ SOLN
INTRAMUSCULAR | Status: AC
  Filled 2024-05-12: qty 2

## 2024-05-12 MED ORDER — PHENYLEPHRINE 80 MCG/ML (10ML) SYRINGE FOR IV PUSH (FOR BLOOD PRESSURE SUPPORT)
PREFILLED_SYRINGE | INTRAVENOUS | Status: DC | PRN
  Administered 2024-05-12 (×2): 80 ug via INTRAVENOUS
  Administered 2024-05-12: 120 ug via INTRAVENOUS
  Administered 2024-05-12: 80 ug via INTRAVENOUS
  Administered 2024-05-12: 160 ug via INTRAVENOUS
  Administered 2024-05-12: 80 ug via INTRAVENOUS
  Administered 2024-05-12: 160 ug via INTRAVENOUS
  Administered 2024-05-12: 120 ug via INTRAVENOUS
  Administered 2024-05-12: 160 ug via INTRAVENOUS

## 2024-05-12 MED ORDER — METHYLENE BLUE (ANTIDOTE) 1 % IV SOLN
INTRAVENOUS | Status: DC | PRN
  Administered 2024-05-12: 3 mL

## 2024-05-12 MED ORDER — PHENYLEPHRINE HCL-NACL 20-0.9 MG/250ML-% IV SOLN
INTRAVENOUS | Status: AC
  Filled 2024-05-12: qty 250

## 2024-05-12 MED ORDER — OXYCODONE-ACETAMINOPHEN 5-325 MG PO TABS: 1.0000 | ORAL_TABLET | Freq: Four times a day (QID) | ORAL | 0 refills | Status: AC | PRN

## 2024-05-12 MED ORDER — HYDROMORPHONE HCL 1 MG/ML IJ SOLN
0.2500 mg | INTRAMUSCULAR | Status: DC | PRN
  Administered 2024-05-12: 0.25 mg via INTRAVENOUS
  Administered 2024-05-12: 0.5 mg via INTRAVENOUS
  Administered 2024-05-12: 0.25 mg via INTRAVENOUS

## 2024-05-12 MED ORDER — CHLORHEXIDINE GLUCONATE 0.12 % MT SOLN
OROMUCOSAL | Status: AC
  Filled 2024-05-12: qty 15

## 2024-05-12 MED ORDER — DEXAMETHASONE SOD PHOSPHATE PF 10 MG/ML IJ SOLN
INTRAMUSCULAR | Status: AC
  Filled 2024-05-12: qty 1

## 2024-05-12 NOTE — Op Note (Signed)
 " Operative Note    Name: Sarah Carrillo  Date of Service: 05/12/2024   DOB: 02-20-1976  MRN: [id number]    Pre-Operative Diagnosis:  1) menorrhagia with regular cycle 2) fibroid uterus with multiple large fibroids  Post-Operative Diagnosis:  1) menorrhagia with regular cycle 2) fibroid uterus with multiple large fibroids  Procedures:  1) Robot assisted Total Laparoscopic Hysterectomy, bilateral salpingectomy (uterus weight in OR, 804 grams) 2) Myomectomy 3) Cystoscopy  Primary Surgeon: Garnette Mace, MD  Assistant Surgeon: Heather Penton, MD   EBL: 250 mL   IVF: 2,000 mL   Urine output: 1,000 mL  Specimens: Uterus with multiple fibroids, cervix, and bilateral fallopian tubes  Drains: none  Complications: None   Disposition: PACU   Condition: Stable   Findings:  1) enlarged uterus with multiple fibroids and a grossly enlarged fundal fibroid with a large face connecting to the uterus (weight of specimen was 804 grams in OR) 2) normal appearing ovaries, bilateral fallopian tubes 3) grossly enlarged lateral uterine vasculature 4) enlarged right external iliac venous supply 5) gel-like sac along the lateral uterus extending down to the cervix and also extending along the infundibulopelvic ligament 6) on cystoscopy, bladder without evidence of damage.  There was noted to be efflux of urine from the bilateral ureteral orifices.  Procedure Summary:  The patient was taken to the operating room where general anesthesia was administered and found to be adequate. She was placed in the dorsal supine lithotomy position in [place] stirrups and prepped and draped in the usual, sterile fashion. After a timeout was called an indwelling catheter was placed in her bladder.  A sterile speculum was placed in her vagina.  The anterior lip of the cervix was grasped with the single-tooth tenaculum.  The cervix was serially dilated to an 11 Pratt dilator.  The large Vcare device was placed in  accordance to the manufacturer's recommendations.  The tenaculum and speculum were removed.   Attention was turned to the abdomen where after injection of local anesthetic, an 8 mm supraumbilical incision was made with the scalpel. Entry into the abdomen was obtained via Optiview trocar technique (a blunt entry technique with camera visualization through the obturator upon entry). Verification of entry into the abdomen was obtained using opening pressures. The abdomen was insufflated with CO2. The camera was introduced through the trocar with verification of atraumatic entry.  Right and left abdominal entry sites were created after injection of local anesthetic about 8 cm lateral to the umbilical port in accordance with the Intuitive manufacturer's recommendations.  An additional trocar site was created approximately 8 cm lateral to the right port with more than 2 cm clearance from the anterior superior iliac spine on the right.  The port sites were 8 mm.  The intuitive trochars were introduced under intra-abdominal camera visualization without difficulty   The XI robot was docked on the patient's left.  Clearance was verified from the patient's legs.  Through the umbilical port the camera was placed.  Through the port attached to arm 3 the monopolar scissors were placed.  Through the port attached to arm 1 the forced bipolar forceps were was placed.  Through the port attached to arm for the fenestrated bipolar forceps were attached.   An inspection was undertaken of the pelvis with the above-noted findings. The bilateral ureters were identified and found to be well away from the operative area of interest. The left fallopian tube was grasped at the fimbriated end and was transected along  the mesosalpinx in a lateral-to-medial fashion.  The round ligament was cauterized and transected on the left.  Given the enlarged vessels and jellylike substance the gel-like sacs were attempted to be included in the entire  specimen.  Therefore, the left ureter was dissected out in the retroperitoneal space and followed till its connection to the bladder to ensure exact knowledge of its location given the location of the sacs that were attempted to be removed.  Ultimately, the broad ligament on the left was opened both anteriorly and posteriorly and a bladder flap was created without difficulty.  The bladder was backfilled with a small dilute solution of methylene blue  and saline.  Of note, the uterus was injected with a dilute solution of vasopressin  (20 units and 100 mL of saline).  Approximately 10 mL of the solution was injected into the uterus to decrease blood loss.  The left uterine artery was skeletonized, cauterized and transected.  A similar procedure was carried out on the right side as it was similar in characteristics.  The fundal fibroid was well out of the field of view.  After draining the bladder, the colpotomy was performed using monopolar electrocautery in a circumferential fashion following the KOH ring.  The specimen was too large to remove through the vagina so it was placed in the left lower quadrant.   Closure of the vaginal cuff was undertaken using the 0 Stratafix on a CT2 stitch in a running fashion.  All vascular pedicles were inspected and found to be hemostatic with the intraabdominal pressure lowered to 5 mmHg.  The robot was undocked from the camera port and an approximately 3 cm incision was made around this incision.  The mini GelPort was placed and the trocar was placed through the GelPort.  This arm was redocked.  Prior to the redocking of the arm the specimen retrieval bag was placed through the GelPort.  The specimen was placed into the specimen retrieval bag and held in place.  All instruments removed from the robotic ports.  The robot was undocked from the patient.  The specimen in the retrieval bag was pulled to the GelPort.  The Was removed from the GelPort and the bag was pulled to the  opening.  Using the excite technique the specimen was removed entirely within the bag using cold morcellation via scalpel.  The GelPort was replaced and the abdomen was assured to be free of active bleeding and hemostasis was verified.  All instruments were removed.  The abdomen was emptied of CO2 with the aid of 5 deep breaths from anesthesia.  All trocars were removed.  The suprapubic incision was closed at the level of the fascia using a running 0 Vicryl.  The subcuticular tissue was reapproximated using 3-0 Vicryl.  All skin incisions were closed using 4-0 Vicryl in a subcuticular fashion and reinforced using surgical skin glue.   Cystoscopy was undertaken at this point. The Foley catheter was removed and the 70 cystoscope was gently introduced through the urethra. The bladder survey was undertaken with efflux of urine from both orifices noted.  The patient was required to be given a small amount of Fluorescein  IV in order to be able to appreciate the flux of urine.  There were no defects noted in the bladder wall. The cystoscope was utilized to fully empty the bladder.  A sterile speculum was placed in the vagina.  The vaginal cuff closure was visualized and found to be hemostatic.  The sterile speculum was removed.  A digital sweep of the vagina was undertaken to ensure no instruments or sponges remained in the vagina and this was verified to be clear.  The assistant in this case placed the catheter, placed uterine manipulator, performed uterine manipulation, assisted in creating the enlarged skin incision, assisted in morcellation of the specimen, closure of the fascia, verifying the vaginal cuff was intact, and verifying that the vagina was free of any instruments or sponges.  This was a very complex procedure given the size of her uterus and a highly specialized assistant was required.  The patient tolerated the procedure well.  Sponge, lap, needle, and instrument counts were correct x 2.  VTE  prophylaxis: SCDs. Antibiotic prophylaxis: Ancef  2 grams IV and Flagyl  500 mg IV were given prior to skin incision. She was awakened in the operating room and was taken to the PACU in stable condition.   Garnette Mace, MD, [facility] 05/12/2024 2:40 PM    "

## 2024-05-12 NOTE — Interval H&P Note (Signed)
 History and Physical Interval Note:  05/12/2024 7:54 AM  Sarah Carrillo  has presented today for surgery, with the diagnosis of fibroid uterus menorrhagia.  The various methods of treatment have been discussed with the patient and family. After consideration of risks, benefits and other options for treatment, the patient has consented to  Procedures with comments: HYSTERECTOMY, TOTAL, LAPAROSCOPIC, ROBOT-ASSISTED WITH SALPINGECTOMY (Bilateral) - MYOMECTOMY CYSTOSCOPY (N/A) as a surgical intervention.  The patient's history has been reviewed, patient examined, no change in status, stable for surgery.  I have reviewed the patient's chart and labs.  Questions were answered to the patient's satisfaction.    Sarah Mace, MD, [facility] 05/12/2024 7:54 AM

## 2024-05-12 NOTE — Transfer of Care (Signed)
 Immediate Anesthesia Transfer of Care Note  Patient: Sarah Carrillo  Procedure(s) Performed: HYSTERECTOMY, TOTAL, LAPAROSCOPIC, ROBOT-ASSISTED WITH SALPINGECTOMY (Bilateral: Uterus) CYSTOSCOPY (Bladder) MYOMECTOMY, ROBOT-ASSISTED (Abdomen)  Patient Location: PACU  Anesthesia Type:General  Level of Consciousness: awake and patient cooperative  Airway & Oxygen Therapy: Patient Spontanous Breathing and Patient connected to face mask oxygen  Post-op Assessment: Report given to RN and Post -op Vital signs reviewed and stable  Post vital signs: stable  Last Vitals:  Vitals Value Taken Time  BP 104/67 05/12/24 14:45  Temp    Pulse 74 05/12/24 14:47  Resp 24 05/12/24 14:47  SpO2 100 % 05/12/24 14:47  Vitals shown include unfiled device data.  Last Pain:  Vitals:   05/12/24 0710  TempSrc: Temporal  PainSc: 0-No pain         Complications: No notable events documented.

## 2024-05-12 NOTE — Anesthesia Preprocedure Evaluation (Addendum)
"                                    Anesthesia Evaluation  Patient identified by MRN, date of birth, ID band Patient awake    Reviewed: Allergy & Precautions, NPO status , Patient's Chart, lab work & pertinent test results  History of Anesthesia Complications Negative for: history of anesthetic complications  Airway Mallampati: I  TM Distance: >3 FB Neck ROM: full    Dental no notable dental hx.    Pulmonary neg pulmonary ROS   Pulmonary exam normal        Cardiovascular negative cardio ROS Normal cardiovascular exam     Neuro/Psych  Neuromuscular disease  negative psych ROS   GI/Hepatic negative GI ROS, Neg liver ROS,,,  Endo/Other  negative endocrine ROS    Renal/GU negative Renal ROS  negative genitourinary   Musculoskeletal   Abdominal   Peds  Hematology negative hematology ROS (+)   Anesthesia Other Findings Past Medical History: No date: B12 deficiency No date: Fibroid uterus No date: Menorrhagia No date: Menorrhagia with regular cycle No date: Sciatic pain No date: Vitamin D  deficiency  Past Surgical History: 06/07/2021: COLONOSCOPY WITH PROPOFOL ; N/A     Comment:  Procedure: COLONOSCOPY WITH PROPOFOL ;  Surgeon: Therisa Bi, MD;  Location: [facility] ENDOSCOPY;  Service:               Gastroenterology;  Laterality: N/A;  BMI    Body Mass Index: 30.18 kg/m      Reproductive/Obstetrics negative OB ROS                              Anesthesia Physical Anesthesia Plan  ASA: 2  Anesthesia Plan: General ETT   Post-op Pain Management: Dilaudid  IV, Ketamine  IV*, Tylenol  PO (pre-op)*, Celebrex  PO (pre-op)* and Precedex    Induction: Intravenous  PONV Risk Score and Plan: 3 and Ondansetron , Dexamethasone , Midazolam  and Treatment may vary due to age or medical condition  Airway Management Planned: Oral ETT  Additional Equipment:   Intra-op Plan:   Post-operative Plan: Extubation in  OR  Informed Consent: I have reviewed the patients History and Physical, chart, labs and discussed the procedure including the risks, benefits and alternatives for the proposed anesthesia with the patient or authorized representative who has indicated his/her understanding and acceptance.     Dental Advisory Given  Plan Discussed with: Anesthesiologist, CRNA and Surgeon  Anesthesia Plan Comments: (Patient consented for risks of anesthesia including but not limited to:  - adverse reactions to medications - damage to eyes, teeth, lips or other oral mucosa - nerve damage due to positioning  - sore throat or hoarseness - Damage to heart, brain, nerves, lungs, other parts of body or loss of life  Patient voiced understanding and assent.)         Anesthesia Quick Evaluation  "

## 2024-05-12 NOTE — Anesthesia Procedure Notes (Signed)
 Procedure Name: Intubation Date/Time: 05/12/2024 8:41 AM  Performed by: Dyane Mass, CRNAPre-anesthesia Checklist: Patient identified, Emergency Drugs available, Suction available and Patient being monitored Patient Re-evaluated:Patient Re-evaluated prior to induction Oxygen Delivery Method: Circle system utilized Preoxygenation: Pre-oxygenation with 100% oxygen Induction Type: IV induction Ventilation: Mask ventilation without difficulty Laryngoscope Size: McGrath and 3 Grade View: Grade I Tube type: Oral Tube size: 7.0 mm Number of attempts: 1 Airway Equipment and Method: Stylet Placement Confirmation: ETT inserted through vocal cords under direct vision, positive ETCO2 and breath sounds checked- equal and bilateral Secured at: 19 cm Tube secured with: Tape Dental Injury: Teeth and Oropharynx as per pre-operative assessment

## 2024-05-13 ENCOUNTER — Encounter: Payer: Self-pay | Admitting: Obstetrics and Gynecology

## 2024-05-13 NOTE — Anesthesia Postprocedure Evaluation (Signed)
"   Anesthesia Post Note  Patient: Sarah Carrillo  Procedure(s) Performed: HYSTERECTOMY, TOTAL, LAPAROSCOPIC, ROBOT-ASSISTED WITH SALPINGECTOMY (Bilateral: Uterus) CYSTOSCOPY (Bladder) MYOMECTOMY, ROBOT-ASSISTED (Abdomen)  Patient location during evaluation: PACU Anesthesia Type: General Level of consciousness: awake and alert Pain management: pain level controlled Vital Signs Assessment: post-procedure vital signs reviewed and stable Respiratory status: spontaneous breathing, nonlabored ventilation and respiratory function stable Cardiovascular status: blood pressure returned to baseline and stable Postop Assessment: no apparent nausea or vomiting Anesthetic complications: no   There were no known notable events for this encounter.   Last Vitals:  Vitals:   05/12/24 1600 05/12/24 1618  BP: 99/72 118/77  Pulse: 82 66  Resp: 12 15  Temp: (!) 36.3 C 36.7 C  SpO2: 95% 100%    Last Pain:  Vitals:   05/13/24 0851  TempSrc:   PainSc: 2                  [name]      "

## 2024-05-14 LAB — SURGICAL PATHOLOGY

## 2024-06-21 ENCOUNTER — Encounter: Admitting: Dietician
